# Patient Record
Sex: Female | Born: 1955 | Race: White | Hispanic: No | Marital: Married | Smoking: Former smoker
Health system: Southern US, Community
[De-identification: ages and names within clinical notes are randomized; demographics above are authoritative.]

## PROBLEM LIST (undated history)

## (undated) DIAGNOSIS — F909 Attention-deficit hyperactivity disorder, unspecified type: Secondary | ICD-10-CM

## (undated) DIAGNOSIS — E079 Disorder of thyroid, unspecified: Secondary | ICD-10-CM

## (undated) DIAGNOSIS — K219 Gastro-esophageal reflux disease without esophagitis: Secondary | ICD-10-CM

## (undated) DIAGNOSIS — E0590 Thyrotoxicosis, unspecified without thyrotoxic crisis or storm: Secondary | ICD-10-CM

## (undated) HISTORY — DX: Gastro-esophageal reflux disease without esophagitis: K21.9

## (undated) HISTORY — DX: Thyrotoxicosis, unspecified without thyrotoxic crisis or storm: E05.90

## (undated) HISTORY — DX: Attention-deficit hyperactivity disorder, unspecified type: F90.9

---

## 2003-01-24 HISTORY — PX: BREAST BIOPSY: SHX20

## 2010-10-05 LAB — HM COLONOSCOPY

## 2014-05-04 LAB — HM PAP SMEAR: HM Pap smear: NEGATIVE

## 2015-05-24 LAB — HM PAP SMEAR: HM Pap smear: NEGATIVE

## 2017-12-06 DIAGNOSIS — H9313 Tinnitus, bilateral: Secondary | ICD-10-CM | POA: Insufficient documentation

## 2018-03-27 LAB — TSH: TSH: 4.36 (ref 0.41–5.90)

## 2018-03-27 LAB — LIPID PANEL
Cholesterol: 234 — AB (ref 0–200)
HDL: 51 (ref 35–70)
LDL Cholesterol: 143
Triglycerides: 201 — AB (ref 40–160)

## 2018-09-09 LAB — HM MAMMOGRAPHY

## 2018-11-25 ENCOUNTER — Ambulatory Visit
Admission: EM | Admit: 2018-11-25 | Discharge: 2018-11-25 | Disposition: A | Discharge status: Home / Self Care | Payer: BC Managed Care – PPO | Attending: Family Medicine | Admitting: Family Medicine

## 2018-11-25 ENCOUNTER — Other Ambulatory Visit: Payer: Self-pay

## 2018-11-25 DIAGNOSIS — R300 Dysuria: Secondary | ICD-10-CM | POA: Diagnosis not present

## 2018-11-25 DIAGNOSIS — R350 Frequency of micturition: Secondary | ICD-10-CM | POA: Diagnosis not present

## 2018-11-25 DIAGNOSIS — R3915 Urgency of urination: Secondary | ICD-10-CM | POA: Diagnosis not present

## 2018-11-25 LAB — URINALYSIS, COMPLETE (UACMP) WITH MICROSCOPIC
Bacteria, UA: NONE SEEN
Bilirubin Urine: NEGATIVE
Glucose, UA: NEGATIVE mg/dL
Ketones, ur: NEGATIVE mg/dL
Nitrite: NEGATIVE
Protein, ur: NEGATIVE mg/dL
RBC / HPF: NONE SEEN RBC/hpf (ref 0–5)
Specific Gravity, Urine: <1.005 — ABNORMAL LOW (ref 1.005–1.030)
Squamous Epithelial / HPF: NONE SEEN (ref 0–5)
pH: 6 (ref 5.0–8.0)

## 2018-11-25 MED ORDER — CEPHALEXIN 500 MG PO CAPS: 500.0000 mg | ORAL_CAPSULE | Freq: Two times a day (BID) | ORAL | 0 refills | Status: AC

## 2018-11-25 NOTE — ED Triage Notes (Signed)
Patient complains of urinary urgency, frequency and pain after urinating.

## 2018-11-25 NOTE — Discharge Instructions (Addendum)
Take medication as prescribed. Rest. Drink plenty of fluids.  ° °Follow up with your primary care physician this week as needed. Return to Urgent care for new or worsening concerns.  ° °

## 2018-11-25 NOTE — ED Provider Notes (Signed)
[facility] ____________________________________________  Time seen: Approximately 3:18 PM  I have reviewed the triage vital signs and the nursing notes.   HISTORY  Chief Complaint Urinary Frequency   HPI Cassandra Hamilton is a 63 y.o. female presenting for evaluation of urinary frequency, urinary urgency and some pain after urinating present for the last several days.  Patient reports she did feels similar approximately 2 weeks ago in which she took a old prescription of nitrofurantoin.  States this did help but not fully resolved symptoms.  Denies abdominal pain, flank pain, fever, vomiting or diarrhea.  Some low back pain.  Has been moving recently.  New to the area.  Continues eat and drink well.  States history of multiple urinary tract infections in the past with same presentation.  Does take over-the-counter cranberry extract daily to help prevent.  Denies other aggravating or alleviating factors.    medical history UTIs GERD  There are no active problems to display for this patient.   Past Surgical History:  Procedure Laterality Date  . CESAREAN SECTION     x3     No current facility-administered medications for this encounter.   Current Outpatient Medications:  .  esomeprazole (NEXIUM) 20 MG capsule, Take 20 mg by mouth daily at 12 noon., Disp: , Rfl:  .  cephALEXin (KEFLEX) 500 MG capsule, Take 1 capsule (500 mg total) by mouth 2 (two) times daily for 7 days., Disp: 14 capsule, Rfl: 0  Allergies Ciprofloxacin  Family History  Problem Relation Age of Onset  . Cancer Mother   . Testicular cancer Father     Social History Social History   Tobacco Use  . Smoking status: Never Smoker  . Smokeless tobacco: Never Used  Substance Use Topics  . Alcohol use: Yes    Comment: occasionally  . Drug use: Yes    Types: Marijuana    Comment: also does edible.     Review of Systems Constitutional: No fever Cardiovascular: Denies chest pain.  Respiratory: Denies shortness of breath. Gastrointestinal: No abdominal pain.  No nausea, no vomiting.  No diarrhea. Genitourinary: Positive for dysuria. Skin: Negative for rash.    ____________________________________________   PHYSICAL EXAM:  VITAL SIGNS: ED Triage Vitals  Enc Vitals Group     BP 11/25/18 1436 121/85     Pulse Rate 11/25/18 1436 89     Resp 11/25/18 1436 16     Temp 11/25/18 1436 98.6 F (37 C)     Temp Source 11/25/18 1436 Oral     SpO2 11/25/18 1436 100 %     Weight 11/25/18 1433 142 lb (64.4 kg)     Height 11/25/18 1433 5\' 6"  (1.676 m)     Head Circumference --      Peak Flow --      Pain Score 11/25/18 1433 6     Pain Loc --      Pain Edu? --      Excl. in [place]? --     Constitutional: Alert and oriented. Well appearing and in no acute distress. Eyes: Conjunctivae are normal.  ENT      Head: Normocephalic and atraumatic. Cardiovascular: Normal rate, regular rhythm. Grossly normal heart sounds.  Good peripheral circulation. Respiratory: Normal respiratory effort without tachypnea nor retractions. Breath sounds are clear and equal bilaterally. No wheezes, rales, rhonchi. Gastrointestinal: Soft and nontender. No CVA tenderness. Musculoskeletal: Steady gait. No lower back tenderness to palpation. Neurologic:  Normal speech and language. Speech is normal. No gait  instability.  Skin:  Skin is warm, dry and intact. No rash noted. Psychiatric: Mood and affect are normal. Speech and behavior are normal. Patient exhibits appropriate insight and judgment   ___________________________________________   LABS (all labs ordered are listed, but only abnormal results are displayed)  Labs Reviewed  URINALYSIS, COMPLETE (UACMP) WITH MICROSCOPIC - Abnormal; Notable for the following components:      Result Value   Color, Urine STRAW (*)    Specific Gravity, Urine <1.005 (*)    Hgb urine dipstick TRACE (*)    Leukocytes,Ua TRACE (*)    All other components  within normal limits  URINE CULTURE    PROCEDURES Procedures   INITIAL IMPRESSION / ASSESSMENT AND PLAN / ED COURSE  Pertinent labs & imaging results that were available during my care of the patient were reviewed by me and considered in my medical decision making (see chart for details).  Well-appearing patient.  No acute distress.  Dysuria complaints.  Consistent with her previous UTIs.  However urinalysis reviewed, discussed with patient not clear UTI.  Will treat patient with oral Keflex empirically and await urine culture.  Discussed other causes and triggers for dysuria as well.  Encourage fluids, rest and monitoring.Discussed indication, risks and benefits of medications with patient.   Discussed follow up and return parameters including no resolution or any worsening concerns. Patient verbalized understanding and agreed to plan.   ____________________________________________   FINAL CLINICAL IMPRESSION(S) / ED DIAGNOSES  Final diagnoses:  Dysuria     ED Discharge Orders         Ordered    cephALEXin (KEFLEX) 500 MG capsule  2 times daily     11/25/18 1509           Note: This dictation was prepared with Dragon dictation along with smaller phrase technology. Any transcriptional errors that result from this process are unintentional.         Renford Dills, NP 11/25/18 [phone]

## 2018-11-26 LAB — URINE CULTURE: Culture: <10000 — AB

## 2018-11-27 ENCOUNTER — Encounter: Payer: Self-pay | Admitting: Internal Medicine

## 2018-12-09 ENCOUNTER — Encounter: Payer: Self-pay | Admitting: Internal Medicine

## 2018-12-25 ENCOUNTER — Encounter: Payer: Self-pay | Admitting: Internal Medicine

## 2018-12-25 ENCOUNTER — Other Ambulatory Visit: Payer: Self-pay

## 2018-12-25 ENCOUNTER — Ambulatory Visit (INDEPENDENT_AMBULATORY_CARE_PROVIDER_SITE_OTHER): Payer: BC Managed Care – PPO | Admitting: Internal Medicine

## 2018-12-25 VITALS — BP 128/78 | HR 83 | Ht 66.0 in | Wt 146.0 lb

## 2018-12-25 DIAGNOSIS — M19042 Primary osteoarthritis, left hand: Secondary | ICD-10-CM

## 2018-12-25 DIAGNOSIS — E782 Mixed hyperlipidemia: Secondary | ICD-10-CM | POA: Diagnosis not present

## 2018-12-25 DIAGNOSIS — K2100 Gastro-esophageal reflux disease with esophagitis, without bleeding: Secondary | ICD-10-CM | POA: Insufficient documentation

## 2018-12-25 DIAGNOSIS — Z13820 Encounter for screening for osteoporosis: Secondary | ICD-10-CM | POA: Diagnosis not present

## 2018-12-25 DIAGNOSIS — M19041 Primary osteoarthritis, right hand: Secondary | ICD-10-CM | POA: Diagnosis not present

## 2018-12-25 NOTE — Progress Notes (Signed)
Date:  12/25/2018   Name:  Cassandra Hamilton   DOB:  1955/10/03   MRN:  [id number]   Chief Complaint: Establish Care, Gastroesophageal Reflux (Taking nexium and wanting to know if she should continue taking it daily or come off of it. Been taking it since July 2020. ), and Osteoarthritis (Referral for arthritis in hands. )  Mammogram 09/09/2018 - normal Colonoscopy 2012 DEXA - remote  Gastroesophageal Reflux She complains of heartburn. She reports no abdominal pain, no chest pain, no coughing or no wheezing. This is a recurrent problem. The problem occurs occasionally. Pertinent negatives include no fatigue. There are no known risk factors. She has tried a PPI for the symptoms. The treatment provided significant relief. Past procedures include an EGD (showing only esophagitis). She is concerned that taking Nexium long term could have adverse side effects.  Hand Pain  There was no injury mechanism. The quality of the pain is described as aching (the right thumb DIP). Pertinent negatives include no chest pain.  She has OA changes of most DIP joints but no pain or limitation to activities.  No results found for: CREATININE, BUN, NA, K, CL, CO2 No results found for: CHOL, HDL, LDLCALC, LDLDIRECT, TRIG, CHOLHDL No results found for: TSH No results found for: HGBA1C   Review of Systems  Constitutional: Negative for chills, fatigue, fever and unexpected weight change.  HENT: Negative for trouble swallowing.   Respiratory: Negative for cough, chest tightness, shortness of breath and wheezing.   Cardiovascular: Negative for chest pain, palpitations and leg swelling.  Gastrointestinal: Positive for heartburn. Negative for abdominal pain, constipation and diarrhea.  Genitourinary:       Recurrent UTIs  Musculoskeletal: Positive for arthralgias and joint swelling. Negative for myalgias.  Skin: Negative for color change and rash.  Neurological: Negative for facial asymmetry, light-headedness and  headaches.  Psychiatric/Behavioral: Negative for dysphoric mood and sleep disturbance. The patient is not nervous/anxious.     There are no active problems to display for this patient.   Allergies  Allergen Reactions  . Ciprofloxacin     Rash   . Latex Rash    Past Surgical History:  Procedure Laterality Date  . CESAREAN SECTION     x3    Social History   Tobacco Use  . Smoking status: Former Smoker    Packs/day: 0.50    Years: 30.00    Pack years: 15.00    Quit date: 1999    Years since quitting: 21.9  . Smokeless tobacco: Never Used  Substance Use Topics  . Alcohol use: Yes    Alcohol/week: 2.0 - 4.0 standard drinks    Types: 2 - 4 Standard drinks or equivalent per week  . Drug use: Yes    Types: Marijuana    Comment: also does edible.      Medication list has been reviewed and updated.  Current Meds  Medication Sig  . b complex vitamins tablet Take 1 tablet by mouth daily.  . calcium-vitamin D (OSCAL WITH D) 500-200 MG-UNIT tablet Take 1 tablet by mouth.  Tilman Neat EXTRACT PO Take by mouth.  . esomeprazole (NEXIUM) 20 MG capsule Take 20 mg by mouth daily at 12 noon.  Marland Kitchen Kelp 100 MG TABS Take by mouth.    PHQ 2/9 Scores 12/25/2018  PHQ - 2 Score 0    BP Readings from Last 3 Encounters:  12/25/18 128/78  11/25/18 121/85    Physical Exam Vitals signs and nursing note reviewed.  Constitutional:      General: She is not in acute distress.    Appearance: She is well-developed.  HENT:     Head: Normocephalic and atraumatic.  Neck:     Vascular: No carotid bruit.  Cardiovascular:     Rate and Rhythm: Normal rate and regular rhythm.     Pulses: Normal pulses.     Heart sounds: No murmur.  Pulmonary:     Effort: Pulmonary effort is normal. No respiratory distress.     Breath sounds: Normal breath sounds. No wheezing.  Musculoskeletal: Normal range of motion.     Right lower leg: No edema.     Left lower leg: No edema.     Comments: Bouchard and  heberden nodes both hands, worse on right DIP right thumb more swollen but not red or hot; mildly tender with decreased ROM  Lymphadenopathy:     Cervical: Cervical adenopathy present.  Skin:    General: Skin is warm and dry.     Capillary Refill: Capillary refill takes less than 2 seconds.     Findings: No rash.  Neurological:     General: No focal deficit present.     Mental Status: She is alert and oriented to person, place, and time.  Psychiatric:        Attention and Perception: Attention normal.        Mood and Affect: Mood normal.        Behavior: Behavior normal.        Thought Content: Thought content normal.     Wt Readings from Last 3 Encounters:  12/25/18 146 lb (66.2 kg)  11/25/18 142 lb (64.4 kg)    BP 128/78   Pulse 83   Ht 5\' 6"  (1.676 m)   Wt 146 lb (66.2 kg)   SpO2 97%   BMI 23.57 kg/m   Assessment and Plan: 1. Gastroesophageal reflux disease with esophagitis without hemorrhage Symptoms well controlled on daily PPI No red flag signs such as weight loss, n/v, melena Will request EGD records from [place] to determine next evaluation Benefits of PPI outweigh risks at this time Would recommend a DEXA  2. Mixed hyperlipidemia Borderline elevated - recommend continuing healthy diet and exercise  3. Encounter for screening for osteoporosis - DG Bone Density; Future  4. Primary osteoarthritis of both hands Continue activity as tolerated Topical rubs PRN   Partially dictated using Editor, commissioning. Any errors are unintentional.  Halina Maidens, MD [facility]  12/25/2018

## 2018-12-31 ENCOUNTER — Encounter: Payer: Self-pay | Admitting: Internal Medicine

## 2019-01-06 ENCOUNTER — Telehealth: Payer: Self-pay | Admitting: Internal Medicine

## 2019-01-06 NOTE — Telephone Encounter (Signed)
Patient informed and understood.  CM

## 2019-01-06 NOTE — Telephone Encounter (Signed)
Please let patient know that I received her EGD report from 2018.  There were no worrisome findings that required a routine follow up.  She can continue to use Nexium as needed.

## 2019-01-08 ENCOUNTER — Other Ambulatory Visit: Payer: BC Managed Care – PPO

## 2019-01-09 ENCOUNTER — Ambulatory Visit
Admission: RE | Admit: 2019-01-09 | Discharge: 2019-01-09 | Disposition: A | Discharge status: Home / Self Care | Payer: BC Managed Care – PPO | Source: Ambulatory Visit | Attending: Internal Medicine | Admitting: Internal Medicine

## 2019-01-09 DIAGNOSIS — Z13820 Encounter for screening for osteoporosis: Secondary | ICD-10-CM | POA: Insufficient documentation

## 2019-01-09 DIAGNOSIS — M85851 Other specified disorders of bone density and structure, right thigh: Secondary | ICD-10-CM | POA: Diagnosis not present

## 2019-01-10 ENCOUNTER — Encounter: Payer: Self-pay | Admitting: Internal Medicine

## 2019-01-13 ENCOUNTER — Encounter: Payer: Self-pay | Admitting: Internal Medicine

## 2019-03-18 DIAGNOSIS — K0500 Acute gingivitis, plaque induced: Secondary | ICD-10-CM | POA: Diagnosis not present

## 2019-03-18 DIAGNOSIS — Z1281 Encounter for screening for malignant neoplasm of oral cavity: Secondary | ICD-10-CM | POA: Diagnosis not present

## 2019-03-18 DIAGNOSIS — K036 Deposits [accretions] on teeth: Secondary | ICD-10-CM | POA: Diagnosis not present

## 2019-03-20 ENCOUNTER — Encounter: Payer: Self-pay | Admitting: Internal Medicine

## 2019-04-08 ENCOUNTER — Ambulatory Visit (INDEPENDENT_AMBULATORY_CARE_PROVIDER_SITE_OTHER): Payer: BC Managed Care – PPO | Admitting: Internal Medicine

## 2019-04-08 ENCOUNTER — Encounter: Payer: Self-pay | Admitting: Internal Medicine

## 2019-04-08 ENCOUNTER — Other Ambulatory Visit: Payer: Self-pay

## 2019-04-08 VITALS — BP 126/72 | HR 84 | Temp 97.3°F | Ht 66.0 in | Wt 152.0 lb

## 2019-04-08 DIAGNOSIS — N3001 Acute cystitis with hematuria: Secondary | ICD-10-CM | POA: Diagnosis not present

## 2019-04-08 DIAGNOSIS — F39 Unspecified mood [affective] disorder: Secondary | ICD-10-CM

## 2019-04-08 LAB — POC URINALYSIS WITH MICROSCOPIC (NON AUTO)MANUAL RESULT
Bilirubin, UA: NEGATIVE
Crystals: 0
Glucose, UA: NEGATIVE
Ketones, UA: NEGATIVE
Mucus, UA: 0
Nitrite, UA: NEGATIVE
Protein, UA: NEGATIVE
RBC: 2 M/uL — AB (ref 4.04–5.48)
Spec Grav, UA: 1.01 (ref 1.010–1.025)
Urobilinogen, UA: 0.2 E.U./dL
pH, UA: 6 (ref 5.0–8.0)

## 2019-04-08 MED ORDER — NITROFURANTOIN MONOHYD MACRO 100 MG PO CAPS: 100.0000 mg | ORAL_CAPSULE | Freq: Two times a day (BID) | ORAL | 0 refills | Status: DC

## 2019-04-08 NOTE — Progress Notes (Signed)
Date:  04/08/2019   Name:  Cassandra Hamilton   DOB:  Apr 09, 1955   MRN:  [id number]   Chief Complaint: Urinary Tract Infection (Pain when urinating- esp when finishing urinating. Bladder cramps. Started yesterday at noon. ) and Depression (PHQ9- 9)  Urinary Tract Infection  This is a new problem. The current episode started yesterday. The quality of the pain is described as burning. The pain is mild. There has been no fever. Associated symptoms include chills, frequency and urgency. Pertinent negatives include no nausea or vomiting. She has tried increased fluids for the symptoms. The treatment provided no relief.  Depression        This is a recurrent problem.  The problem occurs intermittently.  Associated symptoms include fatigue, irritable, decreased interest and sad.  Associated symptoms include no helplessness, does not have insomnia and no suicidal ideas.  Treatments tried: remote history of SSRI use during a divorce.   No results found for: CREATININE, BUN, NA, K, CL, CO2 Lab Results  Component Value Date   CHOL 234 (A) 03/27/2018   HDL 51 03/27/2018   LDLCALC 143 03/27/2018   TRIG 201 (A) 03/27/2018   Lab Results  Component Value Date   TSH 4.36 03/27/2018   No results found for: HGBA1C No results found for: WBC, HGB, HCT, MCV, PLT No results found for: ALT, AST, GGT, ALKPHOS, BILITOT   Review of Systems  Constitutional: Positive for chills and fatigue. Negative for fever.  Respiratory: Negative for chest tightness and shortness of breath.   Cardiovascular: Negative for chest pain.  Gastrointestinal: Positive for abdominal pain. Negative for diarrhea, nausea and vomiting.  Genitourinary: Positive for dysuria, frequency and urgency.  Psychiatric/Behavioral: Positive for depression, dysphoric mood and sleep disturbance. Negative for suicidal ideas. The patient is not nervous/anxious and does not have insomnia.     Patient Active Problem List   Diagnosis Date Noted  .  Mixed hyperlipidemia 12/25/2018  . Gastroesophageal reflux disease with esophagitis without hemorrhage 12/25/2018  . Primary osteoarthritis of both hands 12/25/2018    Allergies  Allergen Reactions  . Ciprofloxacin     Rash   . Latex Rash    Past Surgical History:  Procedure Laterality Date  . CESAREAN SECTION     x3    Social History   Tobacco Use  . Smoking status: Former Smoker    Packs/day: 0.50    Years: 30.00    Pack years: 15.00    Quit date: 1999    Years since quitting: 22.2  . Smokeless tobacco: Never Used  Substance Use Topics  . Alcohol use: Yes    Alcohol/week: 2.0 - 4.0 standard drinks    Types: 2 - 4 Standard drinks or equivalent per week  . Drug use: Yes    Types: Marijuana    Comment: also does edible.      Medication list has been reviewed and updated.  Current Meds  Medication Sig  . Ascorbic Acid (VITAMIN C) 1000 MG tablet Take 1,000 mg by mouth daily.  Marland Kitchen b complex vitamins tablet Take 1 tablet by mouth daily.  . Boron 3 MG CAPS Take by mouth.  . calcium-vitamin D (OSCAL WITH D) 500-200 MG-UNIT tablet Take 1 tablet by mouth. Separate pill.  Marland Kitchen CRANBERRY EXTRACT PO Take by mouth.  . esomeprazole (NEXIUM) 20 MG capsule Take 20 mg by mouth daily at 12 noon.  Marland Kitchen Kelp 100 MG TABS Take by mouth.  . magnesium 30 MG tablet Take 30  mg by mouth 2 (two) times daily.  . Turmeric 500 MG CAPS Take by mouth.    PHQ 2/9 Scores 04/08/2019 12/25/2018  PHQ - 2 Score 4 0  PHQ- 9 Score 9 -    BP Readings from Last 3 Encounters:  04/08/19 126/72  12/25/18 128/78  11/25/18 121/85    Physical Exam Vitals and nursing note reviewed.  Constitutional:      General: She is irritable. She is not in acute distress.    Appearance: Normal appearance. She is well-developed.  Cardiovascular:     Rate and Rhythm: Normal rate and regular rhythm.     Heart sounds: Normal heart sounds.  Pulmonary:     Effort: Pulmonary effort is normal. No respiratory distress.      Breath sounds: Normal breath sounds.  Abdominal:     General: Bowel sounds are normal.     Palpations: Abdomen is soft.     Tenderness: There is abdominal tenderness in the suprapubic area. There is no guarding or rebound.  Musculoskeletal:     Cervical back: Normal range of motion.  Neurological:     Mental Status: She is alert.  Psychiatric:        Mood and Affect: Mood normal.        Behavior: Behavior normal.     Wt Readings from Last 3 Encounters:  04/08/19 152 lb (68.9 kg)  12/25/18 146 lb (66.2 kg)  11/25/18 142 lb (64.4 kg)    BP 126/72   Pulse 84   Temp (!) 97.3 F (36.3 C) (Temporal)   Ht 5\' 6"  (1.676 m)   Wt 152 lb (68.9 kg)   SpO2 100%   BMI 24.53 kg/m   Assessment and Plan: 1. Acute cystitis with hematuria Continue fluids Return if sx persist - POC urinalysis w microscopic (non auto) - nitrofurantoin, macrocrystal-monohydrate, (MACROBID) 100 MG capsule; Take 1 capsule (100 mg total) by mouth 2 (two) times daily for 7 days.  Dispense: 14 capsule; Refill: 0  2. Mood disorder ([place]) Mild intermittent symptoms for years not requiring treatment Continue to monitor - return if worsening   Partially dictated using Editor, commissioning. Any errors are unintentional.  Halina Maidens, MD [facility]  04/08/2019

## 2019-04-08 NOTE — Patient Instructions (Signed)
Covid Vaccine locations: [facility] [phone]  [facility] [phone]  [email or web address]  [email or web address] and search vaccine availability

## 2019-04-10 ENCOUNTER — Other Ambulatory Visit: Payer: Self-pay | Admitting: Internal Medicine

## 2019-04-10 DIAGNOSIS — N3001 Acute cystitis with hematuria: Secondary | ICD-10-CM

## 2019-04-10 MED ORDER — SULFAMETHOXAZOLE-TRIMETHOPRIM 800-160 MG PO TABS: 1.0000 | ORAL_TABLET | Freq: Two times a day (BID) | ORAL | 0 refills | Status: AC

## 2019-04-11 ENCOUNTER — Telehealth: Payer: Self-pay

## 2019-04-11 NOTE — Telephone Encounter (Signed)
Sent in another antibiotic

## 2019-04-11 NOTE — Telephone Encounter (Signed)
Patient called saying she started on Macrobid several days ago for her UTI and she is in more pain that she was before. Said macrobid has not helped her in the past. Wants to know if she can try a different antibiotic for her UTI symptoms.  Please Advise.  CM

## 2019-04-29 ENCOUNTER — Other Ambulatory Visit: Payer: Self-pay

## 2019-04-29 ENCOUNTER — Encounter: Payer: Self-pay | Admitting: Internal Medicine

## 2019-04-29 ENCOUNTER — Ambulatory Visit (INDEPENDENT_AMBULATORY_CARE_PROVIDER_SITE_OTHER): Payer: BC Managed Care – PPO | Admitting: Internal Medicine

## 2019-04-29 VITALS — BP 112/62 | HR 73 | Temp 97.1°F | Ht 66.0 in | Wt 150.0 lb

## 2019-04-29 DIAGNOSIS — M19041 Primary osteoarthritis, right hand: Secondary | ICD-10-CM | POA: Diagnosis not present

## 2019-04-29 DIAGNOSIS — G5602 Carpal tunnel syndrome, left upper limb: Secondary | ICD-10-CM | POA: Diagnosis not present

## 2019-04-29 DIAGNOSIS — Z0000 Encounter for general adult medical examination without abnormal findings: Secondary | ICD-10-CM

## 2019-04-29 DIAGNOSIS — Z1231 Encounter for screening mammogram for malignant neoplasm of breast: Secondary | ICD-10-CM | POA: Diagnosis not present

## 2019-04-29 DIAGNOSIS — M19042 Primary osteoarthritis, left hand: Secondary | ICD-10-CM

## 2019-04-29 DIAGNOSIS — K2100 Gastro-esophageal reflux disease with esophagitis, without bleeding: Secondary | ICD-10-CM

## 2019-04-29 DIAGNOSIS — H9313 Tinnitus, bilateral: Secondary | ICD-10-CM | POA: Diagnosis not present

## 2019-04-29 LAB — POCT URINALYSIS DIPSTICK
Bilirubin, UA: NEGATIVE
Blood, UA: NEGATIVE
Glucose, UA: NEGATIVE
Ketones, UA: NEGATIVE
Nitrite, UA: NEGATIVE
Protein, UA: NEGATIVE
Spec Grav, UA: 1.01 (ref 1.010–1.025)
Urobilinogen, UA: 0.2 E.U./dL
pH, UA: 8 (ref 5.0–8.0)

## 2019-04-29 NOTE — Patient Instructions (Addendum)
Dr. Aubery Lapping - Dermatology [phone]   Carpal Tunnel Syndrome  Carpal tunnel syndrome is a condition that causes pain in your hand and arm. The carpal tunnel is a narrow area located on the palm side of your wrist. Repeated wrist motion or certain diseases may cause swelling within the tunnel. This swelling pinches the main nerve in the wrist (median nerve). What are the causes? This condition may be caused by:  Repeated wrist motions.  Wrist injuries.  Arthritis.  A cyst or tumor in the carpal tunnel.  Fluid buildup during pregnancy. Sometimes the cause of this condition is not known. What increases the risk? The following factors may make you more likely to develop this condition:  Having a job, such as being a Research scientist (life sciences), that requires you to repeatedly move your wrist in the same motion.  Being a woman.  Having certain conditions, such as: ? Diabetes. ? Obesity. ? An underactive thyroid (hypothyroidism). ? Kidney failure. What are the signs or symptoms? Symptoms of this condition include:  A tingling feeling in your fingers, especially in your thumb, index, and middle fingers.  Tingling or numbness in your hand.  An aching feeling in your entire arm, especially when your wrist and elbow are bent for a long time.  Wrist pain that goes up your arm to your shoulder.  Pain that goes down into your palm or fingers.  A weak feeling in your hands. You may have trouble grabbing and holding items. Your symptoms may feel worse during the night. How is this diagnosed? This condition is diagnosed with a medical history and physical exam. You may also have tests, including:  Electromyogram (EMG). This test measures electrical signals sent by your nerves into the muscles.  Nerve conduction study. This test measures how well electrical signals pass through your nerves.  Imaging tests, such as X-rays, ultrasound, and MRI. These tests check for possible  causes of your condition. How is this treated? This condition may be treated with:  Lifestyle changes. It is important to stop or change the activity that caused your condition.  Doing exercise and activities to strengthen your muscles and bones (physical therapy).  Learning how to use your hand again after diagnosis (occupational therapy).  Medicines for pain and inflammation. This may include medicine that is injected into your wrist.  A wrist splint.  Surgery. Follow these instructions at home: If you have a splint:  Wear the splint as told by your health care provider. Remove it only as told by your health care provider.  Loosen the splint if your fingers tingle, become numb, or turn cold and blue.  Keep the splint clean.  If the splint is not waterproof: ? Do not let it get wet. ? Cover it with a watertight covering when you take a bath or shower. Managing pain, stiffness, and swelling   If directed, put ice on the painful area: ? If you have a removable splint, remove it as told by your health care provider. ? Put ice in a plastic bag. ? Place a towel between your skin and the bag. ? Leave the ice on for 20 minutes, 2-3 times per day. General instructions  Take over-the-counter and prescription medicines only as told by your health care provider.  Rest your wrist from any activity that may be causing your pain. If your condition is work related, talk with your employer about changes that can be made, such as getting a wrist pad to use while  typing.  Do any exercises as told by your health care provider, physical therapist, or occupational therapist.  Keep all follow-up visits as told by your health care provider. This is important. Contact a health care provider if:  You have new symptoms.  Your pain is not controlled with medicines.  Your symptoms get worse. Get help right away if:  You have severe numbness or tingling in your wrist or  hand. Summary  Carpal tunnel syndrome is a condition that causes pain in your hand and arm.  It is usually caused by repeated wrist motions.  Lifestyle changes and medicines are used to treat carpal tunnel syndrome. Surgery may be recommended.  Follow your health care provider's instructions about wearing a splint, resting from activity, keeping follow-up visits, and calling for help. This information is not intended to replace advice given to you by your health care provider. Make sure you discuss any questions you have with your health care provider. Document Revised: 05/18/2017 Document Reviewed: 05/18/2017 Elsevier Patient Education  2020 [employer].

## 2019-04-29 NOTE — Progress Notes (Signed)
Date:  04/29/2019   Name:  Cassandra Hamilton   DOB:  [birth year]   MRN:  [id number]   Chief Complaint: Annual Exam (breast exam - no pap) [name] is a 64 y.o. female who presents today for her Complete Annual Exam. She feels well. She reports exercising regularly. She reports she is sleeping fairly well.   Mammogram  08/2018 @ [facility]  05/2015 NIL with neg HPV CRC screen  09/2010 Immunization History  Administered Date(s) Administered  . Hepatitis A 10/27/2014  . Hepatitis A, Adult 10/27/2014, 05/05/2015  . Influenza,inj,Quad PF,6+ Mos 11/25/2018  . Influenza-Unspecified 10/24/2018  . Tdap 04/08/2009  . Zoster 05/05/2015  . Zoster Recombinat (Shingrix) 11/23/2016, 12/17/2016, 12/09/2018    Gastroesophageal Reflux She complains of heartburn. She reports no abdominal pain, no chest pain, no coughing or no wheezing. Pertinent negatives include no fatigue. She has tried a PPI for the symptoms. The treatment provided significant relief.  Hand Pain  There was no injury mechanism. The pain is present in the right fingers and left fingers. The quality of the pain is described as aching. The pain is mild. The pain has been fluctuating since the incident. Pertinent negatives include no chest pain. Associated symptoms comments: OA changes.    No results found for: CREATININE, BUN, NA, K, CL, CO2 Lab Results  Component Value Date   CHOL 234 (A) 03/27/2018   HDL 51 03/27/2018   LDLCALC 143 03/27/2018   TRIG 201 (A) 03/27/2018   Lab Results  Component Value Date   TSH 4.36 03/27/2018   No results found for: HGBA1C No results found for: WBC, HGB, HCT, MCV, PLT No results found for: ALT, AST, GGT, ALKPHOS, BILITOT   Review of Systems  Constitutional: Negative for chills, fatigue, fever and unexpected weight change.  HENT: Positive for hearing loss (has aid on right) and tinnitus. Negative for congestion, trouble swallowing and voice change.   Eyes: Negative for visual disturbance.   Respiratory: Negative for cough, chest tightness, shortness of breath and wheezing.   Cardiovascular: Positive for palpitations. Negative for chest pain and leg swelling.  Gastrointestinal: Positive for heartburn. Negative for abdominal pain, constipation, diarrhea and vomiting.  Endocrine: Negative for polydipsia and polyuria.  Genitourinary: Negative for dysuria, frequency, genital sores, vaginal bleeding and vaginal discharge.  Musculoskeletal: Negative for arthralgias, gait problem and joint swelling.  Skin: Negative for color change and rash.  Neurological: Negative for dizziness, tremors, light-headedness and headaches.  Hematological: Negative for adenopathy. Does not bruise/bleed easily.  Psychiatric/Behavioral: Negative for dysphoric mood and sleep disturbance. The patient is not nervous/anxious.     Patient Active Problem List   Diagnosis Date Noted  . Mixed hyperlipidemia 12/25/2018  . Gastroesophageal reflux disease with esophagitis without hemorrhage 12/25/2018  . Primary osteoarthritis of both hands 12/25/2018  . Tinnitus of both ears 12/06/2017    Allergies  Allergen Reactions  . Ciprofloxacin     Rash   . Latex Rash    Past Surgical History:  Procedure Laterality Date  . CESAREAN SECTION     x3    Social History   Tobacco Use  . Smoking status: Former Smoker    Packs/day: 0.50    Years: 30.00    Pack years: 15.00    Quit date: 1999    Years since quitting: 22.2  . Smokeless tobacco: Never Used  Substance Use Topics  . Alcohol use: Yes    Alcohol/week: 2.0 - 4.0 standard drinks    Types:  2 - 4 Standard drinks or equivalent per week  . Drug use: Yes    Types: Marijuana    Comment: also does edible.      Medication list has been reviewed and updated.  Current Meds  Medication Sig  . Ascorbic Acid (VITAMIN C) 1000 MG tablet Take 1,000 mg by mouth daily.  Marland Kitchen b complex vitamins tablet Take 1 tablet by mouth daily.  . Boron 3 MG CAPS Take by  mouth. Tablet  . CRANBERRY EXTRACT PO Take by mouth.  . esomeprazole (NEXIUM) 20 MG capsule Take 20 mg by mouth daily at 12 noon.  Marland Kitchen Kelp 100 MG TABS Take by mouth.  . magnesium 30 MG tablet Take 30 mg by mouth.   . Turmeric 500 MG CAPS Take by mouth.    PHQ 2/9 Scores 04/29/2019 04/08/2019 12/25/2018  PHQ - 2 Score 0 4 0  PHQ- 9 Score 0 9 -    BP Readings from Last 3 Encounters:  04/29/19 112/62  04/08/19 126/72  12/25/18 128/78    Physical Exam Vitals and nursing note reviewed.  Constitutional:      General: She is not in acute distress.    Appearance: Normal appearance. She is well-developed.  HENT:     Head: Normocephalic and atraumatic.     Right Ear: Tympanic membrane and ear canal normal.     Left Ear: Tympanic membrane and ear canal normal.     Nose:     Right Sinus: No maxillary sinus tenderness.     Left Sinus: No maxillary sinus tenderness.  Eyes:     General: No scleral icterus.       Right eye: No discharge.        Left eye: No discharge.     Conjunctiva/sclera: Conjunctivae normal.  Neck:     Thyroid: No thyromegaly.     Vascular: No carotid bruit.  Cardiovascular:     Rate and Rhythm: Normal rate and regular rhythm.     Pulses: Normal pulses.     Heart sounds: Normal heart sounds.  Pulmonary:     Effort: Pulmonary effort is normal. No respiratory distress.     Breath sounds: No wheezing.  Chest:     Breasts:        Right: No mass, nipple discharge, skin change or tenderness.        Left: No mass, nipple discharge, skin change or tenderness.  Abdominal:     General: Bowel sounds are normal.     Palpations: Abdomen is soft.     Tenderness: There is no abdominal tenderness.  Musculoskeletal:        General: Normal range of motion.     Cervical back: Normal range of motion. No erythema.     Right lower leg: No edema.     Left lower leg: No edema.     Comments: OA changes to fingers of both hands  Lymphadenopathy:     Cervical: No cervical  adenopathy.  Skin:    General: Skin is warm and dry.     Capillary Refill: Capillary refill takes less than 2 seconds.     Findings: No rash.  Neurological:     Mental Status: She is alert and oriented to person, place, and time.     Cranial Nerves: No cranial nerve deficit.     Sensory: No sensory deficit.     Motor: Motor function is intact.     Coordination: Coordination is intact.  Deep Tendon Reflexes:     Reflex Scores:      Bicep reflexes are 1+ on the right side and 1+ on the left side.      Patellar reflexes are 2+ on the right side and 2+ on the left side.    Comments: Phalen's positive on left  Psychiatric:        Attention and Perception: Attention normal.        Mood and Affect: Mood normal.        Speech: Speech normal.        Behavior: Behavior normal.     Wt Readings from Last 3 Encounters:  04/29/19 150 lb (68 kg)  04/08/19 152 lb (68.9 kg)  12/25/18 146 lb (66.2 kg)    BP 112/62   Pulse 73   Temp (!) 97.1 F (36.2 C) (Temporal)   Ht 5\' 6"  (1.676 m)   Wt 150 lb (68 kg)   SpO2 98%   BMI 24.21 kg/m   Assessment and Plan: 1. Annual physical exam Normal exam Continue healthy diet, regular exercise - Lipid panel - TSH - POCT urinalysis dipstick  2. Encounter for screening mammogram for breast cancer To be scheduled at [facility] - MM 3D SCREEN BREAST BILATERAL; Future  3. Gastroesophageal reflux disease with esophagitis without hemorrhage Symptoms well controlled on daily PPI No red flag signs such as weight loss, n/v, melena Will continue over the counter nexium. - CBC with Differential/Platelet  4. Tinnitus of both ears With right sided hearing loss - Comprehensive metabolic panel  5. Primary osteoarthritis of both hands Continue topical pain relievers Advil as needed  6. Carpal tunnel syndrome of left wrist Avoid prolonged, repetitive movements Symptoms are very mild at present - no intervention other than activity modification is  needed now  Partially dictated using [facility]. Any errors are unintentional.  Animal nutritionist, MD [facility]  04/29/2019

## 2019-04-30 LAB — COMPREHENSIVE METABOLIC PANEL
ALT: 15 IU/L (ref 0–32)
AST: 17 IU/L (ref 0–40)
Albumin/Globulin Ratio: 2.2 (ref 1.2–2.2)
Albumin: 4.3 g/dL (ref 3.8–4.8)
Alkaline Phosphatase: 94 IU/L (ref 39–117)
BUN/Creatinine Ratio: 14 (ref 12–28)
BUN: 9 mg/dL (ref 8–27)
Bilirubin Total: 0.2 mg/dL (ref 0.0–1.2)
CO2: 24 mmol/L (ref 20–29)
Calcium: 9.1 mg/dL (ref 8.7–10.3)
Chloride: 106 mmol/L (ref 96–106)
Creatinine, Ser: 0.64 mg/dL (ref 0.57–1.00)
GFR calc Af Amer: 109 mL/min/{1.73_m2} (ref >59)
GFR calc non Af Amer: 95 mL/min/{1.73_m2} (ref >59)
Globulin, Total: 2 g/dL (ref 1.5–4.5)
Glucose: 93 mg/dL (ref 65–99)
Potassium: 4.3 mmol/L (ref 3.5–5.2)
Sodium: 142 mmol/L (ref 134–144)
Total Protein: 6.3 g/dL (ref 6.0–8.5)

## 2019-04-30 LAB — LIPID PANEL
Chol/HDL Ratio: 4.8 ratio — ABNORMAL HIGH (ref 0.0–4.4)
Cholesterol, Total: 207 mg/dL — ABNORMAL HIGH (ref 100–199)
HDL: 43 mg/dL (ref >39)
LDL Chol Calc (NIH): 141 mg/dL — ABNORMAL HIGH (ref 0–99)
Triglycerides: 128 mg/dL (ref 0–149)
VLDL Cholesterol Cal: 23 mg/dL (ref 5–40)

## 2019-04-30 LAB — CBC WITH DIFFERENTIAL/PLATELET
Basophils Absolute: 0.1 10*3/uL (ref 0.0–0.2)
Basos: 1 %
EOS (ABSOLUTE): 0.3 10*3/uL (ref 0.0–0.4)
Eos: 5 %
Hematocrit: 40 % (ref 34.0–46.6)
Hemoglobin: 13.5 g/dL (ref 11.1–15.9)
Immature Grans (Abs): 0 10*3/uL (ref 0.0–0.1)
Immature Granulocytes: 0 %
Lymphocytes Absolute: 2.2 10*3/uL (ref 0.7–3.1)
Lymphs: 36 %
MCH: 33 pg (ref 26.6–33.0)
MCHC: 33.8 g/dL (ref 31.5–35.7)
MCV: 98 fL — ABNORMAL HIGH (ref 79–97)
Monocytes Absolute: 0.6 10*3/uL (ref 0.1–0.9)
Monocytes: 10 %
Neutrophils Absolute: 2.9 10*3/uL (ref 1.4–7.0)
Neutrophils: 48 %
Platelets: 280 10*3/uL (ref 150–450)
RBC: 4.09 x10E6/uL (ref 3.77–5.28)
RDW: 12.1 % (ref 11.7–15.4)
WBC: 6.2 10*3/uL (ref 3.4–10.8)

## 2019-04-30 LAB — TSH: TSH: 4.74 u[IU]/mL — ABNORMAL HIGH (ref 0.450–4.500)

## 2019-05-04 ENCOUNTER — Encounter: Payer: Self-pay | Admitting: Internal Medicine

## 2019-05-08 ENCOUNTER — Encounter: Payer: Self-pay | Admitting: Internal Medicine

## 2019-05-08 DIAGNOSIS — Z8742 Personal history of other diseases of the female genital tract: Secondary | ICD-10-CM

## 2019-06-11 ENCOUNTER — Telehealth: Payer: Self-pay | Admitting: Internal Medicine

## 2019-06-11 ENCOUNTER — Other Ambulatory Visit: Payer: Self-pay

## 2019-06-11 DIAGNOSIS — Z8619 Personal history of other infectious and parasitic diseases: Secondary | ICD-10-CM | POA: Insufficient documentation

## 2019-06-11 MED ORDER — VALACYCLOVIR HCL 1 G PO TABS: 1000.0000 mg | ORAL_TABLET | Freq: Two times a day (BID) | ORAL | 1 refills | Status: DC | PRN

## 2019-06-11 NOTE — Telephone Encounter (Signed)
Copied from CRM [id number]. Topic: General - Other >> Jun 11, 2019  9:47 AM Lyn Hollingshead D wrote: Reason for CRM: PT needs call back from a nurse regarding a medication she take in the past, she decline and appointment  / please advise

## 2019-06-11 NOTE — Telephone Encounter (Signed)
Patient requested RF on Valtrex for PRN cold sores.   Sent in Rx.   CM

## 2019-06-11 NOTE — Progress Notes (Signed)
Patient said she has been taking Valtrex PRN for fever blisters. Her current Rx from her previous PCP is expired and wants a new one called into [place] [facility].  Sent medication in and patient informed.   CM

## 2019-07-03 ENCOUNTER — Telehealth: Payer: Self-pay | Admitting: Internal Medicine

## 2019-07-03 NOTE — Telephone Encounter (Signed)
Called pt told her we could not send in abx. just in case she gets UTI while she is out of town . Told pt if she feels like she is getting a UTI while she is out of town to go to a UC. Pt verbalized understanding.  KP

## 2019-07-03 NOTE — Telephone Encounter (Signed)
Pt states she is going to [facility] on a train for 2 weeks.  Pt states she occasionally gets UTI, requesting abx to take with her "in case she gets a UTI". She is leaving Sunday.  Please advise  [id number], [place] - [address] AT [facility] [address] [facility] [address] Phone:  [phone]  Fax:  [phone]

## 2019-08-26 ENCOUNTER — Ambulatory Visit
Admission: RE | Admit: 2019-08-26 | Discharge: 2019-08-26 | Disposition: A | Discharge status: Home / Self Care | Payer: Medicare Other | Source: Ambulatory Visit | Attending: Internal Medicine | Admitting: Internal Medicine

## 2019-08-26 ENCOUNTER — Other Ambulatory Visit: Payer: Self-pay

## 2019-08-26 DIAGNOSIS — Z1231 Encounter for screening mammogram for malignant neoplasm of breast: Secondary | ICD-10-CM | POA: Diagnosis not present

## 2020-01-27 ENCOUNTER — Encounter: Payer: Self-pay | Admitting: Internal Medicine

## 2020-04-29 ENCOUNTER — Other Ambulatory Visit: Payer: Self-pay

## 2020-04-29 ENCOUNTER — Ambulatory Visit (INDEPENDENT_AMBULATORY_CARE_PROVIDER_SITE_OTHER): Payer: Medicare Other | Admitting: Internal Medicine

## 2020-04-29 ENCOUNTER — Encounter: Payer: Self-pay | Admitting: Internal Medicine

## 2020-04-29 VITALS — BP 130/72 | HR 74 | Temp 98.0°F | Ht 66.0 in | Wt 144.0 lb

## 2020-04-29 DIAGNOSIS — M25542 Pain in joints of left hand: Secondary | ICD-10-CM | POA: Insufficient documentation

## 2020-04-29 DIAGNOSIS — Z8742 Personal history of other diseases of the female genital tract: Secondary | ICD-10-CM

## 2020-04-29 DIAGNOSIS — M25541 Pain in joints of right hand: Secondary | ICD-10-CM | POA: Diagnosis not present

## 2020-04-29 DIAGNOSIS — F439 Reaction to severe stress, unspecified: Secondary | ICD-10-CM | POA: Diagnosis not present

## 2020-04-29 DIAGNOSIS — E782 Mixed hyperlipidemia: Secondary | ICD-10-CM | POA: Diagnosis not present

## 2020-04-29 DIAGNOSIS — Z1231 Encounter for screening mammogram for malignant neoplasm of breast: Secondary | ICD-10-CM

## 2020-04-29 DIAGNOSIS — E039 Hypothyroidism, unspecified: Secondary | ICD-10-CM | POA: Insufficient documentation

## 2020-04-29 DIAGNOSIS — K2100 Gastro-esophageal reflux disease with esophagitis, without bleeding: Secondary | ICD-10-CM

## 2020-04-29 DIAGNOSIS — E038 Other specified hypothyroidism: Secondary | ICD-10-CM

## 2020-04-29 DIAGNOSIS — Z23 Encounter for immunization: Secondary | ICD-10-CM

## 2020-04-29 NOTE — Patient Instructions (Signed)
Prevnar-20 pneumonia vaccine is needed 

## 2020-04-29 NOTE — Progress Notes (Signed)
Date:  04/29/2020   Name:  Cassandra Hamilton   DOB:  [birth year]   MRN:  [id number]   Chief Complaint: Annual Exam (Breast exam no pap)  Cassandra Hamilton is a 65 y.o. female who presents today for her Complete Annual Exam. She feels well. She reports exercising walking x2 days a week also yoga. She reports she is sleeping well. Breast complaints none.  Mammogram: 08/2019 DEXA: 12/2018 Osteopenia Pap smear: 07/2018 neg with cotesting Colonoscopy: 09/2010  Immunization History  Administered Date(s) Administered  . Fluad Quad(high Dose 65+) 01/01/2020  . Hepatitis A 10/27/2014  . Hepatitis A, Adult 10/27/2014, 05/05/2015  . Influenza,inj,Quad PF,6+ Mos 11/25/2018  . Influenza-Unspecified 10/24/2018  . PFIZER(Purple Top)SARS-COV-2 Vaccination 04/29/2019, 05/20/2019  . Tdap 04/08/2009  . Zoster 05/05/2015  . Zoster Recombinat (Shingrix) 11/23/2016, 12/17/2016, 12/09/2018    Gastroesophageal Reflux She reports no abdominal pain, no chest pain, no coughing or no wheezing. This is a recurrent problem. The problem occurs rarely. Pertinent negatives include no fatigue. She has tried a PPI for the symptoms.  Hyperlipidemia This is a chronic problem. Recent lipid tests were reviewed and are high (10 yr risk 5%). Associated symptoms include myalgias. Pertinent negatives include no chest pain or shortness of breath. Current antihyperlipidemic treatment includes diet change.  Finger pain - especially her right thumb - very swollen and painful.  She continues to use her hands but is becoming more limited.  She takes tumeric and uses [employer]. Stress - she is feeling anxious due to her husband's health.  Anytime he has the slightest complaint she gets palpitations.  She feels like she needs to see a counselor to help her cope with the situation better. Lab Results  Component Value Date   CREATININE 0.64 04/29/2019   BUN 9 04/29/2019   NA 142 04/29/2019   K 4.3 04/29/2019   CL 106 04/29/2019   CO2 24  04/29/2019   Lab Results  Component Value Date   CHOL 207 (H) 04/29/2019   HDL 43 04/29/2019   LDLCALC 141 (H) 04/29/2019   TRIG 128 04/29/2019   CHOLHDL 4.8 (H) 04/29/2019   Lab Results  Component Value Date   TSH 4.740 (H) 04/29/2019   No results found for: HGBA1C Lab Results  Component Value Date   WBC 6.2 04/29/2019   HGB 13.5 04/29/2019   HCT 40.0 04/29/2019   MCV 98 (H) 04/29/2019   PLT 280 04/29/2019   Lab Results  Component Value Date   ALT 15 04/29/2019   AST 17 04/29/2019   ALKPHOS 94 04/29/2019   BILITOT 0.2 04/29/2019     Review of Systems  Constitutional: Negative for chills, fatigue and fever.  HENT: Negative for congestion, hearing loss, tinnitus, trouble swallowing and voice change.   Eyes: Negative for visual disturbance.  Respiratory: Negative for cough, chest tightness, shortness of breath and wheezing.   Cardiovascular: Negative for chest pain, palpitations and leg swelling.  Gastrointestinal: Negative for abdominal pain, constipation, diarrhea and vomiting.  Endocrine: Negative for polydipsia and polyuria.  Genitourinary: Negative for dysuria, frequency, genital sores, vaginal bleeding and vaginal discharge.  Musculoskeletal: Positive for arthralgias and myalgias. Negative for gait problem and joint swelling.  Skin: Negative for color change and rash.  Neurological: Negative for dizziness, tremors, light-headedness and headaches.  Hematological: Negative for adenopathy. Does not bruise/bleed easily.  Psychiatric/Behavioral: Negative for dysphoric mood and sleep disturbance. The patient is nervous/anxious.     Patient Active Problem List   Diagnosis Date  Noted  . History of cold sores 06/11/2019  . Hx of abnormal cervical Pap smear 05/08/2019  . Carpal tunnel syndrome of left wrist 04/29/2019  . Mixed hyperlipidemia 12/25/2018  . Gastroesophageal reflux disease with esophagitis without hemorrhage 12/25/2018  . Primary osteoarthritis of both  hands 12/25/2018  . Tinnitus of both ears 12/06/2017    Allergies  Allergen Reactions  . Ciprofloxacin     Rash   . Latex Rash    Past Surgical History:  Procedure Laterality Date  . BREAST BIOPSY Left 2005   neg  . CESAREAN SECTION     x3    Social History   Tobacco Use  . Smoking status: Former Smoker    Packs/day: 0.50    Years: 30.00    Pack years: 15.00    Quit date: 1999    Years since quitting: 23.2  . Smokeless tobacco: Never Used  Vaping Use  . Vaping Use: Never used  Substance Use Topics  . Alcohol use: Yes    Alcohol/week: 2.0 - 4.0 standard drinks    Types: 2 - 4 Standard drinks or equivalent per week  . Drug use: Yes    Types: Marijuana    Comment: also does edible.      Medication list has been reviewed and updated.  Current Meds  Medication Sig  . Ascorbic Acid (VITAMIN C) 1000 MG tablet Take 1,000 mg by mouth daily.  Marland Kitchen b complex vitamins tablet Take 1 tablet by mouth daily.  . Boron 3 MG CAPS Take by mouth. Tablet  . Cholecalciferol 50 MCG (2000 UT) TABS Take by mouth.  Tilman Neat EXTRACT PO Take by mouth.  . estradiol (ESTRACE) 0.1 MG/GM vaginal cream as needed.  . Iodine, Kelp, (KELP PO) Take by mouth daily.  . magnesium 30 MG tablet Take 30 mg by mouth.   . nitrofurantoin, macrocrystal-monohydrate, (MACROBID) 100 MG capsule TAKE 1 CAPSULE BY MOUTH AFTER SEX AS NEEDED  . Turmeric 500 MG CAPS Take by mouth.  . valACYclovir (VALTREX) 1000 MG tablet Take 1 tablet (1,000 mg total) by mouth 2 (two) times daily as needed.  . [DISCONTINUED] calcium-vitamin D (OSCAL WITH D) 500-200 MG-UNIT tablet Take 1 tablet by mouth. Separate pill.  . [DISCONTINUED] esomeprazole (NEXIUM) 20 MG capsule Take 20 mg by mouth daily at 12 noon.    PHQ 2/9 Scores 04/29/2020 04/29/2019 04/08/2019 12/25/2018  PHQ - 2 Score 0 0 4 0  PHQ- 9 Score 0 0 9 -    GAD 7 : Generalized Anxiety Score 04/29/2020 04/29/2019  Nervous, Anxious, on Edge 1 1  Control/stop worrying 1 1   Worry too much - different things 1 1  Trouble relaxing 0 1  Restless 0 1  Easily annoyed or irritable 0 0  Afraid - awful might happen 0 1  Total GAD 7 Score 3 6  Anxiety Difficulty - Not difficult at all    BP Readings from Last 3 Encounters:  04/29/20 130/72  04/29/19 112/62  04/08/19 126/72    Physical Exam Vitals and nursing note reviewed.  Constitutional:      General: She is not in acute distress.    Appearance: She is well-developed.  HENT:     Head: Normocephalic and atraumatic.     Right Ear: Tympanic membrane and ear canal normal.     Left Ear: Tympanic membrane and ear canal normal.     Nose:     Right Sinus: No maxillary sinus tenderness.  Left Sinus: No maxillary sinus tenderness.  Eyes:     General: No scleral icterus.       Right eye: No discharge.        Left eye: No discharge.     Conjunctiva/sclera: Conjunctivae normal.  Neck:     Thyroid: No thyromegaly.     Vascular: No carotid bruit.  Cardiovascular:     Rate and Rhythm: Normal rate and regular rhythm.     Pulses: Normal pulses.     Heart sounds: Normal heart sounds.  Pulmonary:     Effort: Pulmonary effort is normal. No respiratory distress.     Breath sounds: No wheezing.  Abdominal:     General: Bowel sounds are normal.     Palpations: Abdomen is soft.     Tenderness: There is no abdominal tenderness.  Musculoskeletal:     Right hand: Swelling, deformity and bony tenderness present. Decreased range of motion.     Cervical back: Normal range of motion. No erythema.     Right lower leg: No edema.     Left lower leg: No edema.  Lymphadenopathy:     Cervical: No cervical adenopathy.  Skin:    General: Skin is warm and dry.     Findings: No rash.  Neurological:     Mental Status: She is alert and oriented to person, place, and time.     Cranial Nerves: No cranial nerve deficit.     Sensory: No sensory deficit.     Deep Tendon Reflexes: Reflexes are normal and symmetric.   Psychiatric:        Attention and Perception: Attention normal.        Mood and Affect: Mood normal.     Wt Readings from Last 3 Encounters:  04/29/20 144 lb (65.3 kg)  04/29/19 150 lb (68 kg)  04/08/19 152 lb (68.9 kg)    BP 130/72   Pulse 74   Temp 98 F (36.7 C) (Oral)   Ht 5\' 6"  (1.676 m)   Wt 144 lb (65.3 kg)   SpO2 98%   BMI 23.24 kg/m   Assessment and Plan: 1. Joint pain in both hands S/p mucus cyst removal in [place] Having more pain and limitation in thumb especially so will refer - Ambulatory referral to Orthopedic Surgery  2. Stress at home Consider counseling - providers given  3. Encounter for screening mammogram for breast cancer To be scheduled - MM 3D SCREEN BREAST BILATERAL; Future  4. Mixed hyperlipidemia Will check labs and advise Previous risk score below average - Lipid panel  5. Gastroesophageal reflux disease with esophagitis without hemorrhage No longer requiring daily treatment with nexium Using [facility] as needed - CBC with Differential/Platelet - Comprehensive metabolic panel  6. Need for vaccination for pneumococcus Will schedule a nurse visit  7. Subclinical hypothyroidism Followed by Dr. [facility]  8. Hx of abnormal cervical Pap smear Last done 2020 - due for repeat with GYN next year   Partially dictated using Dragon software. Any errors are unintentional.  2021, MD [facility]  04/29/2020

## 2020-04-30 ENCOUNTER — Encounter: Payer: Self-pay | Admitting: Internal Medicine

## 2020-04-30 LAB — CBC WITH DIFFERENTIAL/PLATELET
Basophils Absolute: 0.1 10*3/uL (ref 0.0–0.2)
Basos: 1 %
EOS (ABSOLUTE): 0.2 10*3/uL (ref 0.0–0.4)
Eos: 2 %
Hematocrit: 41.2 % (ref 34.0–46.6)
Hemoglobin: 14.1 g/dL (ref 11.1–15.9)
Immature Grans (Abs): 0 10*3/uL (ref 0.0–0.1)
Immature Granulocytes: 0 %
Lymphocytes Absolute: 2.7 10*3/uL (ref 0.7–3.1)
Lymphs: 36 %
MCH: 33.5 pg — ABNORMAL HIGH (ref 26.6–33.0)
MCHC: 34.2 g/dL (ref 31.5–35.7)
MCV: 98 fL — ABNORMAL HIGH (ref 79–97)
Monocytes Absolute: 0.6 10*3/uL (ref 0.1–0.9)
Monocytes: 9 %
Neutrophils Absolute: 3.9 10*3/uL (ref 1.4–7.0)
Neutrophils: 52 %
Platelets: 310 10*3/uL (ref 150–450)
RBC: 4.21 x10E6/uL (ref 3.77–5.28)
RDW: 11.8 % (ref 11.7–15.4)
WBC: 7.5 10*3/uL (ref 3.4–10.8)

## 2020-04-30 LAB — COMPREHENSIVE METABOLIC PANEL
ALT: 16 IU/L (ref 0–32)
AST: 19 IU/L (ref 0–40)
Albumin/Globulin Ratio: 2.3 — ABNORMAL HIGH (ref 1.2–2.2)
Albumin: 4.6 g/dL (ref 3.8–4.8)
Alkaline Phosphatase: 100 IU/L (ref 44–121)
BUN/Creatinine Ratio: 11 — ABNORMAL LOW (ref 12–28)
BUN: 7 mg/dL — ABNORMAL LOW (ref 8–27)
Bilirubin Total: 0.3 mg/dL (ref 0.0–1.2)
CO2: 22 mmol/L (ref 20–29)
Calcium: 9.3 mg/dL (ref 8.7–10.3)
Chloride: 106 mmol/L (ref 96–106)
Creatinine, Ser: 0.61 mg/dL (ref 0.57–1.00)
Globulin, Total: 2 g/dL (ref 1.5–4.5)
Glucose: 86 mg/dL (ref 65–99)
Potassium: 4.4 mmol/L (ref 3.5–5.2)
Sodium: 145 mmol/L — ABNORMAL HIGH (ref 134–144)
Total Protein: 6.6 g/dL (ref 6.0–8.5)
eGFR: 99 mL/min/{1.73_m2} (ref >59)

## 2020-04-30 LAB — LIPID PANEL
Chol/HDL Ratio: 5 ratio — ABNORMAL HIGH (ref 0.0–4.4)
Cholesterol, Total: 204 mg/dL — ABNORMAL HIGH (ref 100–199)
HDL: 41 mg/dL (ref >39)
LDL Chol Calc (NIH): 130 mg/dL — ABNORMAL HIGH (ref 0–99)
Triglycerides: 184 mg/dL — ABNORMAL HIGH (ref 0–149)
VLDL Cholesterol Cal: 33 mg/dL (ref 5–40)

## 2020-05-10 ENCOUNTER — Other Ambulatory Visit: Payer: Self-pay

## 2020-05-10 ENCOUNTER — Ambulatory Visit (INDEPENDENT_AMBULATORY_CARE_PROVIDER_SITE_OTHER): Payer: Medicare Other

## 2020-05-10 DIAGNOSIS — Z23 Encounter for immunization: Secondary | ICD-10-CM | POA: Diagnosis not present

## 2020-08-02 ENCOUNTER — Encounter: Payer: Self-pay | Admitting: Internal Medicine

## 2020-08-04 NOTE — Telephone Encounter (Signed)
Pt called stating that the medication is not working. Attempted to get pt scheduled for an appt, but PCP is booked until 08/11/20. Pt is requesting to have a call back with a sooner appt. Please advise.

## 2020-08-05 ENCOUNTER — Other Ambulatory Visit: Payer: Self-pay | Admitting: Internal Medicine

## 2020-08-05 ENCOUNTER — Ambulatory Visit (INDEPENDENT_AMBULATORY_CARE_PROVIDER_SITE_OTHER): Payer: Medicare Other | Admitting: Internal Medicine

## 2020-08-05 ENCOUNTER — Other Ambulatory Visit: Payer: Self-pay

## 2020-08-05 ENCOUNTER — Encounter: Payer: Self-pay | Admitting: Internal Medicine

## 2020-08-05 VITALS — BP 126/64 | HR 86 | Ht 66.0 in | Wt 145.0 lb

## 2020-08-05 DIAGNOSIS — N3001 Acute cystitis with hematuria: Secondary | ICD-10-CM | POA: Diagnosis not present

## 2020-08-05 LAB — POC URINALYSIS WITH MICROSCOPIC (NON AUTO)MANUAL RESULT
Bilirubin, UA: NEGATIVE
Blood, UA: NEGATIVE
Crystals: 0
Glucose, UA: NEGATIVE
Ketones, UA: NEGATIVE
Mucus, UA: 0
Nitrite, UA: NEGATIVE
Protein, UA: NEGATIVE
RBC: 0 M/uL — AB (ref 4.04–5.48)
Spec Grav, UA: <=1.005 — AB (ref 1.010–1.025)
Urobilinogen, UA: 0.2 E.U./dL
WBC Casts, UA: 100
pH, UA: 7.5 (ref 5.0–8.0)

## 2020-08-05 MED ORDER — SULFAMETHOXAZOLE-TRIMETHOPRIM 800-160 MG PO TABS: 1.0000 | ORAL_TABLET | Freq: Two times a day (BID) | ORAL | 0 refills | Status: AC

## 2020-08-05 NOTE — Progress Notes (Signed)
Date:  08/05/2020   Name:  Cassandra Hamilton   DOB:  [birth year]   MRN:  [id number]   Chief Complaint: Dysuria (Tried Macrobid for 5 days BID and not helping. )  Dysuria  This is a new problem. The current episode started in the past 7 days. The problem has been unchanged. The pain is mild. There has been no fever. Pertinent negatives include no chills, hematuria, nausea, urgency or vomiting. She has tried antibiotics (macrobid no benefit) for the symptoms.   Lab Results  Component Value Date   CREATININE 0.61 04/29/2020   BUN 7 (L) 04/29/2020   NA 145 (H) 04/29/2020   K 4.4 04/29/2020   CL 106 04/29/2020   CO2 22 04/29/2020   Lab Results  Component Value Date   CHOL 204 (H) 04/29/2020   HDL 41 04/29/2020   LDLCALC 130 (H) 04/29/2020   TRIG 184 (H) 04/29/2020   CHOLHDL 5.0 (H) 04/29/2020   Lab Results  Component Value Date   TSH 4.740 (H) 04/29/2019   No results found for: HGBA1C Lab Results  Component Value Date   WBC 7.5 04/29/2020   HGB 14.1 04/29/2020   HCT 41.2 04/29/2020   MCV 98 (H) 04/29/2020   PLT 310 04/29/2020   Lab Results  Component Value Date   ALT 16 04/29/2020   AST 19 04/29/2020   ALKPHOS 100 04/29/2020   BILITOT 0.3 04/29/2020     Review of Systems  Constitutional:  Negative for chills, fatigue and fever.  Respiratory:  Negative for cough, chest tightness and shortness of breath.   Cardiovascular:  Negative for chest pain.  Gastrointestinal:  Positive for abdominal pain. Negative for diarrhea, nausea and vomiting.  Genitourinary:  Positive for dysuria. Negative for hematuria and urgency.   Patient Active Problem List   Diagnosis Date Noted   Subclinical hypothyroidism 04/29/2020   Joint pain in both hands 04/29/2020   History of cold sores 06/11/2019   Hx of abnormal cervical Pap smear 05/08/2019   Carpal tunnel syndrome of left wrist 04/29/2019   Mixed hyperlipidemia 12/25/2018   Gastroesophageal reflux disease with esophagitis without  hemorrhage 12/25/2018   Primary osteoarthritis of both hands 12/25/2018   Tinnitus of both ears 12/06/2017    Allergies  Allergen Reactions   Ciprofloxacin     Rash    Latex Rash    Past Surgical History:  Procedure Laterality Date   BREAST BIOPSY Left 2005   neg   CESAREAN SECTION     x3    Social History   Tobacco Use   Smoking status: Former    Packs/day: 0.50    Years: 30.00    Pack years: 15.00    Types: Cigarettes    Quit date: 1999    Years since quitting: 23.5   Smokeless tobacco: Never  Vaping Use   Vaping Use: Never used  Substance Use Topics   Alcohol use: Yes    Alcohol/week: 2.0 - 4.0 standard drinks    Types: 2 - 4 Standard drinks or equivalent per week   Drug use: Yes    Types: Marijuana    Comment: also does edible.      Medication list has been reviewed and updated.  Current Meds  Medication Sig   Ascorbic Acid (VITAMIN C) 1000 MG tablet Take 1,000 mg by mouth daily.   b complex vitamins tablet Take 1 tablet by mouth daily.   Boron 3 MG CAPS Take by mouth. Tablet  Cholecalciferol 50 MCG (2000 UT) TABS Take by mouth.   CRANBERRY EXTRACT PO Take by mouth.   estradiol (ESTRACE) 0.1 MG/GM vaginal cream as needed.   Iodine, Kelp, (KELP PO) Take by mouth daily.   magnesium 30 MG tablet Take 30 mg by mouth.    nitrofurantoin, macrocrystal-monohydrate, (MACROBID) 100 MG capsule TAKE 1 CAPSULE BY MOUTH AFTER SEX AS NEEDED   Turmeric 500 MG CAPS Take by mouth.   valACYclovir (VALTREX) 1000 MG tablet Take 1 tablet (1,000 mg total) by mouth 2 (two) times daily as needed.    PHQ 2/9 Scores 08/05/2020 04/29/2020 04/29/2019 04/08/2019  PHQ - 2 Score 0 0 0 4  PHQ- 9 Score 2 0 0 9    GAD 7 : Generalized Anxiety Score 08/05/2020 04/29/2020 04/29/2019  Nervous, Anxious, on Edge 1 1 1   Control/stop worrying 0 1 1  Worry too much - different things 1 1 1   Trouble relaxing 0 0 1  Restless 0 0 1  Easily annoyed or irritable 0 0 0  Afraid - awful might happen 0  0 1  Total GAD 7 Score 2 3 6   Anxiety Difficulty Not difficult at all - Not difficult at all    BP Readings from Last 3 Encounters:  08/05/20 126/64  04/29/20 130/72  04/29/19 112/62    Physical Exam Vitals and nursing note reviewed.  Constitutional:      Appearance: Normal appearance. She is well-developed.  Cardiovascular:     Rate and Rhythm: Normal rate and regular rhythm.     Heart sounds: Normal heart sounds.  Pulmonary:     Effort: Pulmonary effort is normal. No respiratory distress.     Breath sounds: Normal breath sounds.  Abdominal:     General: Bowel sounds are normal.     Palpations: Abdomen is soft.     Tenderness: There is abdominal tenderness in the suprapubic area. There is no right CVA tenderness, left CVA tenderness, guarding or rebound.  Musculoskeletal:     Cervical back: Normal range of motion.  Neurological:     Mental Status: She is alert.    Wt Readings from Last 3 Encounters:  08/05/20 145 lb (65.8 kg)  04/29/20 144 lb (65.3 kg)  04/29/19 150 lb (68 kg)    BP 126/64 (BP Location: Right Arm, Patient Position: Sitting, Cuff Size: Normal)   Pulse 86   Ht 5\' 6"  (1.676 m)   Wt 145 lb (65.8 kg)   SpO2 99%   BMI 23.40 kg/m   Assessment and Plan: 1. Acute cystitis with hematuria Continue to push fluids Stop macrobid Will submit culture and begin presumptive treatment with Bactrim DS - POC urinalysis w microscopic (non auto) - Urine Culture - sulfamethoxazole-trimethoprim (BACTRIM DS) 800-160 MG tablet; Take 1 tablet by mouth 2 (two) times daily for 10 days.  Dispense: 20 tablet; Refill: 0   Partially dictated using 08/07/20. Any errors are unintentional.  06/29/20, MD [facility]  08/05/2020

## 2020-08-08 LAB — URINE CULTURE

## 2020-08-16 ENCOUNTER — Ambulatory Visit (INDEPENDENT_AMBULATORY_CARE_PROVIDER_SITE_OTHER): Payer: Medicare Other

## 2020-08-16 DIAGNOSIS — Z0000 Encounter for general adult medical examination without abnormal findings: Secondary | ICD-10-CM | POA: Diagnosis not present

## 2020-08-16 DIAGNOSIS — Z1211 Encounter for screening for malignant neoplasm of colon: Secondary | ICD-10-CM | POA: Diagnosis not present

## 2020-08-16 NOTE — Patient Instructions (Signed)
Cassandra Hamilton , Thank you for taking time to come for your Medicare Wellness Visit. I appreciate your ongoing commitment to your health goals. Please review the following plan we discussed and let me know if I can assist you in the future.   Screening recommendations/referrals: Colonoscopy: done 10/05/10. Referral sent to [facility] today for repeat screening colonoscopy. They will contact you for an appointment.  Mammogram: done 08/26/19. Scheduled for 08/26/20 Bone Density: done 01/09/19 Recommended yearly ophthalmology/optometry visit for glaucoma screening and checkup Recommended yearly dental visit for hygiene and checkup  Vaccinations: Influenza vaccine: done 01/01/20 Pneumococcal vaccine: done 05/10/20 Tdap vaccine: due Shingles vaccine: done 12/17/16 & 12/09/18   Covid-19:done 04/29/19 & 05/20/19  Advanced directives: Please bring a copy of your health care power of attorney and living will to the office at your convenience.   Conditions/risks identified: Keep up the great work!  Next appointment: Follow up in one year for your annual wellness visit    Preventive Care 65 Years and Older, Female Preventive care refers to lifestyle choices and visits with your health care provider that can promote health and wellness. What does preventive care include? A yearly physical exam. This is also called an annual well check. Dental exams once or twice a year. Routine eye exams. Ask your health care provider how often you should have your eyes checked. Personal lifestyle choices, including: Daily care of your teeth and gums. Regular physical activity. Eating a healthy diet. Avoiding tobacco and drug use. Limiting alcohol use. Practicing safe sex. Taking low-dose aspirin every day. Taking vitamin and mineral supplements as recommended by your health care provider. What happens during an annual well check? The services and screenings done by your health care provider during your  annual well check will depend on your age, overall health, lifestyle risk factors, and family history of disease. Counseling  Your health care provider may ask you questions about your: Alcohol use. Tobacco use. Drug use. Emotional well-being. Home and relationship well-being. Sexual activity. Eating habits. History of falls. Memory and ability to understand (cognition). Work and work Astronomer. Reproductive health. Screening  You may have the following tests or measurements: Height, weight, and BMI. Blood pressure. Lipid and cholesterol levels. These may be checked every 5 years, or more frequently if you are over 58 years old. Skin check. Lung cancer screening. You may have this screening every year starting at age 66 if you have a 30-pack-year history of smoking and currently smoke or have quit within the past 15 years. Fecal occult blood test (FOBT) of the stool. You may have this test every year starting at age 48. Flexible sigmoidoscopy or colonoscopy. You may have a sigmoidoscopy every 5 years or a colonoscopy every 10 years starting at age 25. Hepatitis C blood test. Hepatitis B blood test. Sexually transmitted disease (STD) testing. Diabetes screening. This is done by checking your blood sugar (glucose) after you have not eaten for a while (fasting). You may have this done every 1-3 years. Bone density scan. This is done to screen for osteoporosis. You may have this done starting at age 57. Mammogram. This may be done every 1-2 years. Talk to your health care provider about how often you should have regular mammograms. Talk with your health care provider about your test results, treatment options, and if necessary, the need for more tests. Vaccines  Your health care provider may recommend certain vaccines, such as: Influenza vaccine. This is recommended every year. Tetanus, diphtheria, and acellular pertussis (  Tdap, Td) vaccine. You may need a Td booster every 10  years. Zoster vaccine. You may need this after age 26. Pneumococcal 13-valent conjugate (PCV13) vaccine. One dose is recommended after age 67. Pneumococcal polysaccharide (PPSV23) vaccine. One dose is recommended after age 51. Talk to your health care provider about which screenings and vaccines you need and how often you need them. This information is not intended to replace advice given to you by your health care provider. Make sure you discuss any questions you have with your health care provider. Document Released: 02/05/2015 Document Revised: 09/29/2015 Document Reviewed: 11/10/2014 Elsevier Interactive Patient Education  2017 [employer] Prevention in the Home Falls can cause injuries. They can happen to people of all ages. There are many things you can do to make your home safe and to help prevent falls. What can I do on the outside of my home? Regularly fix the edges of walkways and driveways and fix any cracks. Remove anything that might make you trip as you walk through a door, such as a raised step or threshold. Trim any bushes or trees on the path to your home. Use bright outdoor lighting. Clear any walking paths of anything that might make someone trip, such as rocks or tools. Regularly check to see if handrails are loose or broken. Make sure that both sides of any steps have handrails. Any raised decks and porches should have guardrails on the edges. Have any leaves, snow, or ice cleared regularly. Use sand or salt on walking paths during winter. Clean up any spills in your garage right away. This includes oil or grease spills. What can I do in the bathroom? Use night lights. Install grab bars by the toilet and in the tub and shower. Do not use towel bars as grab bars. Use non-skid mats or decals in the tub or shower. If you need to sit down in the shower, use a plastic, non-slip stool. Keep the floor dry. Clean up any water that spills on the floor as soon as it  happens. Remove soap buildup in the tub or shower regularly. Attach bath mats securely with double-sided non-slip rug tape. Do not have throw rugs and other things on the floor that can make you trip. What can I do in the bedroom? Use night lights. Make sure that you have a light by your bed that is easy to reach. Do not use any sheets or blankets that are too big for your bed. They should not hang down onto the floor. Have a firm chair that has side arms. You can use this for support while you get dressed. Do not have throw rugs and other things on the floor that can make you trip. What can I do in the kitchen? Clean up any spills right away. Avoid walking on wet floors. Keep items that you use a lot in easy-to-reach places. If you need to reach something above you, use a strong step stool that has a grab bar. Keep electrical cords out of the way. Do not use floor polish or wax that makes floors slippery. If you must use wax, use non-skid floor wax. Do not have throw rugs and other things on the floor that can make you trip. What can I do with my stairs? Do not leave any items on the stairs. Make sure that there are handrails on both sides of the stairs and use them. Fix handrails that are broken or loose. Make sure that handrails are  as long as the stairways. Check any carpeting to make sure that it is firmly attached to the stairs. Fix any carpet that is loose or worn. Avoid having throw rugs at the top or bottom of the stairs. If you do have throw rugs, attach them to the floor with carpet tape. Make sure that you have a light switch at the top of the stairs and the bottom of the stairs. If you do not have them, ask someone to add them for you. What else can I do to help prevent falls? Wear shoes that: Do not have high heels. Have rubber bottoms. Are comfortable and fit you well. Are closed at the toe. Do not wear sandals. If you use a stepladder: Make sure that it is fully opened.  Do not climb a closed stepladder. Make sure that both sides of the stepladder are locked into place. Ask someone to hold it for you, if possible. Clearly mark and make sure that you can see: Any grab bars or handrails. First and last steps. Where the edge of each step is. Use tools that help you move around (mobility aids) if they are needed. These include: Canes. Walkers. Scooters. Crutches. Turn on the lights when you go into a dark area. Replace any light bulbs as soon as they burn out. Set up your furniture so you have a clear path. Avoid moving your furniture around. If any of your floors are uneven, fix them. If there are any pets around you, be aware of where they are. Review your medicines with your doctor. Some medicines can make you feel dizzy. This can increase your chance of falling. Ask your doctor what other things that you can do to help prevent falls. This information is not intended to replace advice given to you by your health care provider. Make sure you discuss any questions you have with your health care provider. Document Released: 11/05/2008 Document Revised: 06/17/2015 Document Reviewed: 02/13/2014 Elsevier Interactive Patient Education  2017 [employer].

## 2020-08-16 NOTE — Progress Notes (Signed)
Subjective:   Cassandra Hamilton is a 65 y.o. female who presents for an Initial Medicare Annual Wellness Visit.  Virtual Visit via Telephone Note  I connected with  [name] on 08/16/20 at 10:40 AM EDT by telephone and verified that I am speaking with the correct person using two identifiers.  Location: Patient: home Provider: [facility] Persons participating in the virtual visit: patient/Nurse Health Advisor   I discussed the limitations, risks, security and privacy concerns of performing an evaluation and management service by telephone and the availability of in person appointments. The patient expressed understanding and agreed to proceed.  Interactive audio and video telecommunications were attempted between this nurse and patient, however failed, due to patient having technical difficulties OR patient did not have access to video capability.  We continued and completed visit with audio only.  Some vital signs may be absent or patient reported.   Reather Littler, LPN   Review of Systems     Cardiac Risk Factors include: advanced age (>27men, >38 women)     Objective:    There were no vitals filed for this visit. There is no height or weight on file to calculate BMI.  Advanced Directives 08/16/2020 11/25/2018  Does Patient Have a Medical Advance Directive? Yes No  Type of Estate agent of [place];Living will;Out of facility DNR (pink MOST or yellow form) -  Copy of Healthcare Power of Attorney in Chart? No - copy requested -    Current Medications (verified) Outpatient Encounter Medications as of 08/16/2020  Medication Sig   Ascorbic Acid (VITAMIN C) 1000 MG tablet Take 1,000 mg by mouth daily.   b complex vitamins tablet Take 1 tablet by mouth daily.   Boron 3 MG CAPS Take by mouth. Tablet   Cholecalciferol 50 MCG (2000 UT) TABS Take by mouth.   CRANBERRY EXTRACT PO Take by mouth.   estradiol (ESTRACE) 0.1 MG/GM vaginal cream as needed.   Iodine, Kelp,  (KELP PO) Take by mouth daily.   magnesium 30 MG tablet Take 30 mg by mouth.    nitrofurantoin, macrocrystal-monohydrate, (MACROBID) 100 MG capsule TAKE 1 CAPSULE BY MOUTH AFTER SEX AS NEEDED   Probiotic Product (PROBIOTIC-10 PO) Take by mouth.   Turmeric 500 MG CAPS Take by mouth.   valACYclovir (VALTREX) 1000 MG tablet Take 1 tablet (1,000 mg total) by mouth 2 (two) times daily as needed.   No facility-administered encounter medications on file as of 08/16/2020.    Allergies (verified) Ciprofloxacin and Latex   History: Past Medical History:  Diagnosis Date   ADHD (attention deficit hyperactivity disorder)    GERD (gastroesophageal reflux disease)    Hyperthyroidism    Past Surgical History:  Procedure Laterality Date   BREAST BIOPSY Left 2005   neg   CESAREAN SECTION     x3   Family History  Problem Relation Age of Onset   Cancer Mother    Testicular cancer Father    Diabetes Maternal Grandmother    Breast cancer Neg Hx    Social History   Socioeconomic History   Marital status: Married    Spouse name: Not on file   Number of children: 3   Years of education: Not on file   Highest education level: Not on file  Occupational History   Not on file  Tobacco Use   Smoking status: Former    Packs/day: 0.50    Years: 30.00    Pack years: 15.00    Types: Cigarettes  Quit date: 1999    Years since quitting: 23.5   Smokeless tobacco: Never  Vaping Use   Vaping Use: Never used  Substance and Sexual Activity   Alcohol use: Yes    Alcohol/week: 2.0 - 4.0 standard drinks    Types: 2 - 4 Standard drinks or equivalent per week   Drug use: Yes    Types: Marijuana    Comment: also does edible.    Sexual activity: Yes  Other Topics Concern   Not on file  Social History Narrative   Pt eats plant based diet/vegan   Social Determinants of Health   Financial Resource Strain: Low Risk    Difficulty of Paying Living Expenses: Not hard at all  Food Insecurity: No  Food Insecurity   Worried About Programme researcher, broadcasting/film/video in the Last Year: Never true   Ran Out of Food in the Last Year: Never true  Transportation Needs: No Transportation Needs   Lack of Transportation (Medical): No   Lack of Transportation (Non-Medical): No  Physical Activity: Sufficiently Active   Days of Exercise per Week: 3 days   Minutes of Exercise per Session: 60 min  Stress: No Stress Concern Present   Feeling of Stress : Not at all  Social Connections: Moderately Isolated   Frequency of Communication with Friends and Family: More than three times a week   Frequency of Social Gatherings with Friends and Family: Three times a week   Attends Religious Services: Never   Active Member of Clubs or Organizations: No   Attends Engineer, structural: Never   Marital Status: Married    Tobacco Counseling Counseling given: Not Answered   Clinical Intake:  Pre-visit preparation completed: Yes  Pain : No/denies pain     Nutritional Risks: None Diabetes: No  How often do you need to have someone help you when you read instructions, pamphlets, or other written materials from your doctor or pharmacy?: 1 - Never    Interpreter Needed?: No  Information entered by :: Reather Littler LPN   Activities of Daily Living In your present state of health, do you have any difficulty performing the following activities: 08/16/2020 08/05/2020  Hearing? Y Y  Comment right hearing aids -  Vision? N N  Difficulty concentrating or making decisions? N N  Walking or climbing stairs? N N  Dressing or bathing? N N  Doing errands, shopping? N N  Preparing Food and eating ? N -  Using the Toilet? N -  In the past six months, have you accidently leaked urine? N -  Do you have problems with loss of bowel control? N -  Managing your Medications? N -  Managing your Finances? N -  Housekeeping or managing your Housekeeping? N -  Some recent data might be hidden    Patient Care  Team: Reubin Milan, MD as PCP - General (Internal Medicine) Sherlon Handing, MD as Consulting Physician (Internal Medicine) Ward, Elenora Fender, MD as Referring Physician (Obstetrics and Gynecology)  Indicate any recent Medical Services you may have received from other than [facility] providers in the past year (date may be approximate).     Assessment:   This is a routine wellness examination for [place].  Hearing/Vision screen Hearing Screening - Comments:: Pt wears hearing aid in right ear Vision Screening - Comments:: Annual vision screenings done by [facility]  Dietary issues and exercise activities discussed: Current Exercise Habits: Home exercise routine, Type of exercise: walking;yoga, Time (  Minutes): 60, Frequency (Times/Week): 3, Weekly Exercise (Minutes/Week): 180, Intensity: Mild, Exercise limited by: None identified   Goals Addressed             This Visit's Progress    Increase physical activity       Patient would like to increase physical activity to walking everyday and yoga at least twice per week        Depression Screen PHQ 2/9 Scores 08/16/2020 08/05/2020 04/29/2020 04/29/2019 04/08/2019 12/25/2018  PHQ - 2 Score 0 0 0 0 4 0  PHQ- 9 Score - 2 0 0 9 -    Fall Risk Fall Risk  08/16/2020 08/05/2020 04/29/2020 04/29/2019  Falls in the past year? 0 0 0 0  Number falls in past yr: 0 0 - 0  Injury with Fall? 0 0 - 0  Risk for fall due to : No Fall Risks - - No Fall Risks  Follow up Falls prevention discussed Falls evaluation completed Falls evaluation completed Falls evaluation completed    FALL RISK PREVENTION PERTAINING TO THE HOME:  Any stairs in or around the home? Yes  If so, are there any without handrails? No  Home free of loose throw rugs in walkways, pet beds, electrical cords, etc? Yes  Adequate lighting in your home to reduce risk of falls? Yes   ASSISTIVE DEVICES UTILIZED TO PREVENT FALLS:  Life alert? No  Use of a cane, walker or w/c? No   Grab bars in the bathroom? No  Shower chair or bench in shower? No  Elevated toilet seat or a handicapped toilet? No   TIMED UP AND GO:  Was the test performed? No .  Telephonic visit  Cognitive Function: Normal cognitive status assessed by direct observation by this Nurse Health Advisor. No abnormalities found.           Immunizations Immunization History  Administered Date(s) Administered   Fluad Quad(high Dose 65+) 01/01/2020   Hepatitis A 10/27/2014   Hepatitis A, Adult 10/27/2014, 05/05/2015   Influenza,inj,Quad PF,6+ Mos 11/25/2018   Influenza-Unspecified 10/24/2018   PFIZER(Purple Top)SARS-COV-2 Vaccination 04/29/2019, 05/20/2019   PNEUMOCOCCAL CONJUGATE-20 05/10/2020   Tdap 04/08/2009   Zoster Recombinat (Shingrix) 12/17/2016, 12/09/2018   Zoster, Live 05/05/2015    TDAP status: Due, Education has been provided regarding the importance of this vaccine. Advised may receive this vaccine at local pharmacy or Health Dept. Aware to provide a copy of the vaccination record if obtained from local pharmacy or Health Dept. Verbalized acceptance and understanding.  Flu Vaccine status: Up to date  Pneumococcal vaccine status: Up to date  Covid-19 vaccine status: Completed vaccines  Qualifies for Shingles Vaccine? Yes   Zostavax completed Yes   Shingrix Completed?: Yes  Screening Tests Health Maintenance  Topic Date Due   PNA vac Low Risk Adult (1 of 2 - PCV13) 08/02/2019   COVID-19 Vaccine (3 - Booster for Pfizer series) 10/20/2019   INFLUENZA VACCINE  08/23/2020   MAMMOGRAM  08/25/2020   COLONOSCOPY (Pts 45-2576yrs Insurance coverage will need to be confirmed)  10/04/2020   DEXA SCAN  Completed   Hepatitis C Screening  Completed   Zoster Vaccines- Shingrix  Completed   HPV VACCINES  Aged Out   TETANUS/TDAP  Discontinued    Health Maintenance  Health Maintenance Due  Topic Date Due   PNA vac Low Risk Adult (1 of 2 - PCV13) 08/02/2019   COVID-19 Vaccine (3 -  Booster for Pfizer series) 10/20/2019    Colorectal cancer screening:  Type of screening: Colonoscopy. Completed 10/05/10. Repeat every 10 years. Referral sent today to [facility].   Mammogram status: Completed 08/26/19. Repeat every year. Scheduled for 08/26/20  Bone Density status: Completed 01/09/19. Results reflect: Bone density results: OSTEOPENIA. Repeat every 2 years.  Lung Cancer Screening: (Low Dose CT Chest recommended if Age 58-80 years, 30 pack-year currently smoking OR have quit w/in 15years.) does not qualify.   Additional Screening:  Hepatitis C Screening: does qualify; Completed 05/05/15  Vision Screening: Recommended annual ophthalmology exams for early detection of glaucoma and other disorders of the eye. Is the patient up to date with their annual eye exam?  Yes  Who is the provider or what is the name of the office in which the patient attends annual eye exams? [facility].   Dental Screening: Recommended annual dental exams for proper oral hygiene  Community Resource Referral / Chronic Care Management: CRR required this visit?  No   CCM required this visit?  No      Plan:     I have personally reviewed and noted the following in the patient's chart:   Medical and social history Use of alcohol, tobacco or illicit drugs  Current medications and supplements including opioid prescriptions. Patient is not currently taking opioid prescriptions. Functional ability and status Nutritional status Physical activity Advanced directives List of other physicians Hospitalizations, surgeries, and ER visits in previous 12 months Vitals Screenings to include cognitive, depression, and falls Referrals and appointments  In addition, I have reviewed and discussed with patient certain preventive protocols, quality metrics, and best practice recommendations. A written personalized care plan for preventive services as well as general preventive health recommendations were  provided to patient.     Reather Littler, LPN   6/00/4599   Nurse Notes: none

## 2020-08-17 ENCOUNTER — Other Ambulatory Visit: Payer: Self-pay

## 2020-08-17 MED ORDER — CLENPIQ 10-3.5-12 MG-GM -GM/160ML PO SOLN: 320.0000 mL | ORAL | 0 refills | Status: DC

## 2020-08-24 ENCOUNTER — Encounter: Payer: Self-pay | Admitting: Internal Medicine

## 2020-08-25 ENCOUNTER — Other Ambulatory Visit: Payer: Self-pay

## 2020-08-25 MED ORDER — CLENPIQ 10-3.5-12 MG-GM -GM/160ML PO SOLN: 1.0000 | Freq: Once | ORAL | 0 refills | Status: AC

## 2020-08-25 NOTE — Progress Notes (Signed)
Patient requested for prep to be sent to different pharmacy. Sent to [facility] in [place].

## 2020-08-26 ENCOUNTER — Other Ambulatory Visit: Payer: Self-pay

## 2020-08-26 ENCOUNTER — Ambulatory Visit
Admission: RE | Admit: 2020-08-26 | Discharge: 2020-08-26 | Disposition: A | Discharge status: Home / Self Care | Payer: Medicare Other | Source: Ambulatory Visit | Attending: Internal Medicine | Admitting: Internal Medicine

## 2020-08-26 DIAGNOSIS — Z1231 Encounter for screening mammogram for malignant neoplasm of breast: Secondary | ICD-10-CM | POA: Insufficient documentation

## 2020-11-02 ENCOUNTER — Encounter: Payer: Self-pay | Admitting: Gastroenterology

## 2020-11-19 ENCOUNTER — Other Ambulatory Visit: Payer: Self-pay

## 2020-11-19 ENCOUNTER — Ambulatory Visit: Payer: Medicare Other | Admitting: Anesthesiology

## 2020-11-19 ENCOUNTER — Ambulatory Visit
Admission: RE | Admit: 2020-11-19 | Discharge: 2020-11-19 | Disposition: A | Discharge status: Home / Self Care | Payer: Medicare Other | Attending: Gastroenterology | Admitting: Gastroenterology

## 2020-11-19 ENCOUNTER — Encounter
Admission: RE | Disposition: A | Discharge status: Home / Self Care | Payer: Self-pay | Source: Home / Self Care | Attending: Gastroenterology

## 2020-11-19 ENCOUNTER — Encounter: Payer: Self-pay | Admitting: Gastroenterology

## 2020-11-19 DIAGNOSIS — Z79899 Other long term (current) drug therapy: Secondary | ICD-10-CM | POA: Diagnosis not present

## 2020-11-19 DIAGNOSIS — Z833 Family history of diabetes mellitus: Secondary | ICD-10-CM | POA: Insufficient documentation

## 2020-11-19 DIAGNOSIS — Z809 Family history of malignant neoplasm, unspecified: Secondary | ICD-10-CM | POA: Insufficient documentation

## 2020-11-19 DIAGNOSIS — Z8043 Family history of malignant neoplasm of testis: Secondary | ICD-10-CM | POA: Insufficient documentation

## 2020-11-19 DIAGNOSIS — Z881 Allergy status to other antibiotic agents status: Secondary | ICD-10-CM | POA: Insufficient documentation

## 2020-11-19 DIAGNOSIS — Z1211 Encounter for screening for malignant neoplasm of colon: Secondary | ICD-10-CM | POA: Diagnosis present

## 2020-11-19 DIAGNOSIS — Z91040 Latex allergy status: Secondary | ICD-10-CM | POA: Insufficient documentation

## 2020-11-19 DIAGNOSIS — Z87891 Personal history of nicotine dependence: Secondary | ICD-10-CM | POA: Diagnosis not present

## 2020-11-19 HISTORY — PX: COLONOSCOPY: SHX5424

## 2020-11-19 HISTORY — DX: Disorder of thyroid, unspecified: E07.9

## 2020-11-19 SURGERY — COLONOSCOPY
Anesthesia: General

## 2020-11-19 MED ORDER — ACETAMINOPHEN 160 MG/5ML PO SOLN: 325.0000 mg | Freq: Once | ORAL | Status: DC

## 2020-11-19 MED ORDER — PROPOFOL 10 MG/ML IV BOLUS
INTRAVENOUS | Status: DC | PRN
  Administered 2020-11-19: 60 mg via INTRAVENOUS
  Administered 2020-11-19 (×3): 30 mg via INTRAVENOUS

## 2020-11-19 MED ORDER — LACTATED RINGERS IV SOLN: INTRAVENOUS | Status: DC

## 2020-11-19 MED ORDER — LIDOCAINE HCL (CARDIAC) PF 100 MG/5ML IV SOSY
PREFILLED_SYRINGE | INTRAVENOUS | Status: DC | PRN
  Administered 2020-11-19: 40 mg via INTRAVENOUS

## 2020-11-19 MED ORDER — ACETAMINOPHEN 325 MG PO TABS
325.0000 mg | ORAL_TABLET | Freq: Once | ORAL | Status: DC
Start: 2020-11-19 — End: 2020-11-19

## 2020-11-19 MED ORDER — SODIUM CHLORIDE 0.9 % IV SOLN: INTRAVENOUS | Status: DC

## 2020-11-19 MED ORDER — STERILE WATER FOR IRRIGATION IR SOLN
Status: DC | PRN
  Administered 2020-11-19: 1

## 2020-11-19 SURGICAL SUPPLY — 6 items
GOWN CVR UNV OPN BCK APRN NK (MISCELLANEOUS) ×2 IMPLANT
GOWN ISOL THUMB LOOP REG UNIV (MISCELLANEOUS) ×4
KIT PRC NS LF DISP ENDO (KITS) ×1 IMPLANT
KIT PROCEDURE OLYMPUS (KITS) ×2
MANIFOLD NEPTUNE II (INSTRUMENTS) ×2 IMPLANT
WATER STERILE IRR 250ML POUR (IV SOLUTION) ×2 IMPLANT

## 2020-11-19 NOTE — Anesthesia Procedure Notes (Signed)
Date/Time: 11/19/2020 8:56 AM Performed by: Lily Kocher, CRNA Pre-anesthesia Checklist: Patient identified, Emergency Drugs available, Suction available, Patient being monitored and Timeout performed Patient Re-evaluated:Patient Re-evaluated prior to induction Oxygen Delivery Method: Nasal cannula Placement Confirmation: positive ETCO2

## 2020-11-19 NOTE — Transfer of Care (Signed)
Immediate Anesthesia Transfer of Care Note  Patient: Cassandra Hamilton  Procedure(s) Performed: COLONOSCOPY  Patient Location: PACU  Anesthesia Type: General  Level of Consciousness: awake, alert  and patient cooperative  Airway and Oxygen Therapy: Patient Spontanous Breathing and Patient connected to supplemental oxygen  Post-op Assessment: Post-op Vital signs reviewed, Patient's Cardiovascular Status Stable, Respiratory Function Stable, Patent Airway and No signs of Nausea or vomiting  Post-op Vital Signs: Reviewed and stable  Complications: No notable events documented.

## 2020-11-19 NOTE — H&P (Signed)
Midge Minium, MD [facility] [address] [place], [place] [address] Phone: [phone] Fax : [phone]  Primary Care Physician:  Reubin Milan, MD Primary Gastroenterologist:  Dr. Servando Snare  Pre-Procedure History & Physical: HPI:  Cassandra Hamilton is a 65 y.o. female is here for a screening colonoscopy.   Past Medical History:  Diagnosis Date   ADHD (attention deficit hyperactivity disorder)    GERD (gastroesophageal reflux disease)    Hyperthyroidism    Thyroid disease     Past Surgical History:  Procedure Laterality Date   BREAST BIOPSY Left 2005   neg   CESAREAN SECTION     x3    Prior to Admission medications   Medication Sig Start Date End Date Taking? Authorizing Provider  Ascorbic Acid (VITAMIN C) 1000 MG tablet Take 1,000 mg by mouth daily.   Yes [provider]  b complex vitamins tablet Take 1 tablet by mouth daily.   Yes [provider]  Boron 3 MG CAPS Take by mouth. Tablet   Yes [provider]  Cholecalciferol 50 MCG (2000 UT) TABS Take by mouth.   Yes [provider]  CRANBERRY EXTRACT PO Take by mouth.   Yes [provider]  estradiol (ESTRACE) 0.1 MG/GM vaginal cream as needed. 07/04/19  Yes [provider]  Iodine, Kelp, (KELP PO) Take by mouth daily.   Yes [provider]  nitrofurantoin, macrocrystal-monohydrate, (MACROBID) 100 MG capsule TAKE 1 CAPSULE BY MOUTH AFTER SEX AS NEEDED 07/04/19  Yes [provider]  Probiotic Product (PROBIOTIC-10 PO) Take by mouth.   Yes [provider]  Turmeric 500 MG CAPS Take by mouth.   Yes [provider]  valACYclovir (VALTREX) 1000 MG tablet Take 1 tablet (1,000 mg total) by mouth 2 (two) times daily as needed. 06/11/19  Yes Reubin Milan, MD  magnesium 30 MG tablet Take 30 mg by mouth.  Patient not taking: Reported on 11/02/2020    [provider]    Allergies as of 08/17/2020 - Review Complete 08/16/2020   Allergen Reaction Noted   Ciprofloxacin  11/25/2018   Latex Rash 11/07/2012    Family History  Problem Relation Age of Onset   Cancer Mother    Testicular cancer Father    Diabetes Maternal Grandmother    Breast cancer Neg Hx     Social History   Socioeconomic History   Marital status: Married    Spouse name: Not on file   Number of children: 3   Years of education: Not on file   Highest education level: Not on file  Occupational History   Not on file  Tobacco Use   Smoking status: Former    Packs/day: 0.50    Years: 30.00    Pack years: 15.00    Types: Cigarettes    Quit date: 1999    Years since quitting: 23.8   Smokeless tobacco: Never  Vaping Use   Vaping Use: Never used  Substance and Sexual Activity   Alcohol use: Yes    Alcohol/week: 2.0 - 4.0 standard drinks    Types: 2 - 4 Standard drinks or equivalent per week   Drug use: Yes    Types: Marijuana    Comment: also does edible.    Sexual activity: Yes  Other Topics Concern   Not on file  Social History Narrative   Pt eats plant based diet/vegan   Social Determinants of Health   Financial Resource Strain: Low Risk  Difficulty of Paying Living Expenses: Not hard at all  Food Insecurity: No Food Insecurity   Worried About Running Out of Food in the Last Year: Never true   Ran Out of Food in the Last Year: Never true  Transportation Needs: No Transportation Needs   Lack of Transportation (Medical): No   Lack of Transportation (Non-Medical): No  Physical Activity: Sufficiently Active   Days of Exercise per Week: 3 days   Minutes of Exercise per Session: 60 min  Stress: No Stress Concern Present   Feeling of Stress : Not at all  Social Connections: Moderately Isolated   Frequency of Communication with Friends and Family: More than three times a week   Frequency of Social Gatherings with Friends and Family: Three times a week   Attends Religious Services: Never   Active Member of Clubs or  Organizations: No   Attends Banker Meetings: Never   Marital Status: Married  Catering manager Violence: Not At Risk   Fear of Current or Ex-Partner: No   Emotionally Abused: No   Physically Abused: No   Sexually Abused: No    Review of Systems: See HPI, otherwise negative ROS  Physical Exam: BP 138/86   Pulse 83   Temp (!) 97.3 F (36.3 C)   Ht 5\' 6"  (1.676 m)   Wt 63 kg   SpO2 99%   BMI 22.44 kg/m  General:   Alert,  pleasant and cooperative in NAD Head:  Normocephalic and atraumatic. Neck:  Supple; no masses or thyromegaly. Lungs:  Clear throughout to auscultation.    Heart:  Regular rate and rhythm. Abdomen:  Soft, nontender and nondistended. Normal bowel sounds, without guarding, and without rebound.   Neurologic:  Alert and  oriented x4;  grossly normal neurologically.  Impression/Plan: Cassandra Hamilton is now here to undergo a screening colonoscopy.  Risks, benefits, and alternatives regarding colonoscopy have been reviewed with the patient.  Questions have been answered.  All parties agreeable.

## 2020-11-19 NOTE — Anesthesia Postprocedure Evaluation (Signed)
Anesthesia Post Note  Patient: Cassandra Hamilton  Procedure(s) Performed: COLONOSCOPY     Patient location during evaluation: PACU Anesthesia Type: General Level of consciousness: awake and alert and oriented Pain management: satisfactory to patient Vital Signs Assessment: post-procedure vital signs reviewed and stable Respiratory status: spontaneous breathing, nonlabored ventilation and respiratory function stable Cardiovascular status: blood pressure returned to baseline and stable Postop Assessment: Adequate PO intake and No signs of nausea or vomiting Anesthetic complications: no   No notable events documented.  [place]

## 2020-11-19 NOTE — Op Note (Signed)
[facility] Gastroenterology Patient Name: Cassandra Hamilton Procedure Date: 11/19/2020 8:52 AM MRN: [id number] Account #: [id number] Date of Birth: 02-Jan-1956 Admit Type: Outpatient Age: 65 Room: [facility] OR ROOM 01 Gender: Female Note Status: Finalized Instrument Name: [id number] Procedure:             Colonoscopy Indications:           Screening for colorectal malignant neoplasm Providers:             Midge Minium MD, MD Referring MD:          Bari Edward, MD (Referring MD) Medicines:             Propofol per Anesthesia Complications:         No immediate complications. Procedure:             Pre-Anesthesia Assessment:                        - Prior to the procedure, a History and Physical was                         performed, and patient medications and allergies were                         reviewed. The patient's tolerance of previous                         anesthesia was also reviewed. The risks and benefits                         of the procedure and the sedation options and risks                         were discussed with the patient. All questions were                         answered, and informed consent was obtained. Prior                         Anticoagulants: The patient has taken no previous                         anticoagulant or antiplatelet agents. ASA Grade                         Assessment: II - A patient with mild systemic disease.                         After reviewing the risks and benefits, the patient                         was deemed in satisfactory condition to undergo the                         procedure.                        After obtaining informed consent, the colonoscope was  passed under direct vision. Throughout the procedure,                         the patient's blood pressure, pulse, and oxygen                         saturations were monitored continuously. The                         Colonoscope  was introduced through the anus and                         advanced to the the cecum, identified by appendiceal                         orifice and ileocecal valve. The colonoscopy was                         performed without difficulty. The patient tolerated                         the procedure well. The quality of the bowel                         preparation was excellent. Findings:      The perianal and digital rectal examinations were normal.      The colon (entire examined portion) appeared normal. Impression:            - The entire examined colon is normal.                        - No specimens collected. Recommendation:        - Discharge patient to home.                        - Resume previous diet.                        - Continue present medications.                        - Repeat colonoscopy in 10 years for screening                         purposes. Procedure Code(s):     --- Professional ---                        [phone], Colonoscopy, flexible; diagnostic, including                         collection of specimen(s) by brushing or washing, when                         performed (separate procedure) Diagnosis Code(s):     --- Professional ---                        Z12.11, Encounter for screening for malignant neoplasm  of colon CPT copyright 2019 American Medical Association. All rights reserved. The codes documented in this report are preliminary and upon coder review may  be revised to meet current compliance requirements. Midge Minium MD, MD 11/19/2020 9:10:51 AM This report has been signed electronically. Number of Addenda: 0 Note Initiated On: 11/19/2020 8:52 AM Scope Withdrawal Time: 0 hours 6 minutes 36 seconds  Total Procedure Duration: 0 hours 9 minutes 9 seconds  Estimated Blood Loss:  Estimated blood loss: none.      [facility]

## 2020-11-19 NOTE — Anesthesia Preprocedure Evaluation (Signed)
Anesthesia Evaluation  Patient identified by MRN, date of birth, ID band Patient awake    Reviewed: Allergy & Precautions, H&P , NPO status , Patient's Chart, lab work & pertinent test results  Airway Mallampati: II  TM Distance: >3 FB Neck ROM: full    Dental no notable dental hx.    Pulmonary former smoker,    Pulmonary exam normal breath sounds clear to auscultation       Cardiovascular Normal cardiovascular exam Rhythm:regular Rate:Normal     Neuro/Psych PSYCHIATRIC DISORDERS    GI/Hepatic GERD  ,  Endo/Other  Hyperthyroidism   Renal/GU      Musculoskeletal   Abdominal   Peds  Hematology   Anesthesia Other Findings   Reproductive/Obstetrics                             Anesthesia Physical Anesthesia Plan  ASA: 2  Anesthesia Plan: General   Post-op Pain Management:    Induction: Intravenous  PONV Risk Score and Plan: 3 and Treatment may vary due to age or medical condition, Propofol infusion and TIVA  Airway Management Planned: Natural Airway  Additional Equipment:   Intra-op Plan:   Post-operative Plan:   Informed Consent: I have reviewed the patients History and Physical, chart, labs and discussed the procedure including the risks, benefits and alternatives for the proposed anesthesia with the patient or authorized representative who has indicated his/her understanding and acceptance.     Dental Advisory Given  Plan Discussed with: CRNA  Anesthesia Plan Comments:         Anesthesia Quick Evaluation

## 2020-11-22 ENCOUNTER — Encounter: Payer: Self-pay | Admitting: Gastroenterology

## 2021-01-10 ENCOUNTER — Other Ambulatory Visit: Payer: Self-pay

## 2021-01-10 ENCOUNTER — Ambulatory Visit (INDEPENDENT_AMBULATORY_CARE_PROVIDER_SITE_OTHER): Payer: Medicare Other | Admitting: Internal Medicine

## 2021-01-10 ENCOUNTER — Encounter: Payer: Self-pay | Admitting: Internal Medicine

## 2021-01-10 VITALS — BP 130/76 | HR 86 | Ht 66.0 in | Wt 145.8 lb

## 2021-01-10 DIAGNOSIS — B009 Herpesviral infection, unspecified: Secondary | ICD-10-CM | POA: Diagnosis not present

## 2021-01-10 DIAGNOSIS — Z23 Encounter for immunization: Secondary | ICD-10-CM

## 2021-01-10 DIAGNOSIS — H9313 Tinnitus, bilateral: Secondary | ICD-10-CM | POA: Diagnosis not present

## 2021-01-10 MED ORDER — VALACYCLOVIR HCL 1 G PO TABS: 1000.0000 mg | ORAL_TABLET | Freq: Two times a day (BID) | ORAL | 0 refills | Status: AC | PRN

## 2021-01-10 NOTE — Progress Notes (Signed)
Date:  01/10/2021   Name:  Cassandra Hamilton   DOB:  05/12/1955   MRN:  [id number]   Chief Complaint: No chief complaint on file.  Mouth Lesions  The current episode started more than 1 week ago. The problem occurs occasionally. The problem is mild. Associated symptoms include hearing loss and mouth sores. Pertinent negatives include no fever, no double vision, no photophobia, no congestion, no headaches, no wheezing, no eye discharge and no eye pain.   Lab Results  Component Value Date   NA 145 (H) 04/29/2020   K 4.4 04/29/2020   CO2 22 04/29/2020   GLUCOSE 86 04/29/2020   BUN 7 (L) 04/29/2020   CREATININE 0.61 04/29/2020   CALCIUM 9.3 04/29/2020   EGFR 99 04/29/2020   GFRNONAA 95 04/29/2019   Lab Results  Component Value Date   CHOL 204 (H) 04/29/2020   HDL 41 04/29/2020   LDLCALC 130 (H) 04/29/2020   TRIG 184 (H) 04/29/2020   CHOLHDL 5.0 (H) 04/29/2020   Lab Results  Component Value Date   TSH 4.740 (H) 04/29/2019   No results found for: HGBA1C Lab Results  Component Value Date   WBC 7.5 04/29/2020   HGB 14.1 04/29/2020   HCT 41.2 04/29/2020   MCV 98 (H) 04/29/2020   PLT 310 04/29/2020   Lab Results  Component Value Date   ALT 16 04/29/2020   AST 19 04/29/2020   ALKPHOS 100 04/29/2020   BILITOT 0.3 04/29/2020   No results found for: 25OHVITD2, 25OHVITD3, VD25OH   Review of Systems  Constitutional:  Negative for chills, fatigue and fever.  HENT:  Positive for hearing loss, mouth sores and tinnitus. Negative for congestion.   Eyes:  Negative for double vision, photophobia, pain and discharge.  Respiratory:  Negative for shortness of breath and wheezing.   Cardiovascular:  Negative for chest pain and palpitations.  Neurological:  Negative for dizziness, light-headedness and headaches.  Psychiatric/Behavioral:  Negative for dysphoric mood and sleep disturbance. The patient is not nervous/anxious.    Patient Active Problem List   Diagnosis Date Noted    Special screening for malignant neoplasms, colon    Subclinical hypothyroidism 04/29/2020   Joint pain in both hands 04/29/2020   History of cold sores 06/11/2019   Hx of abnormal cervical Pap smear 05/08/2019   Carpal tunnel syndrome of left wrist 04/29/2019   Mixed hyperlipidemia 12/25/2018   Gastroesophageal reflux disease with esophagitis without hemorrhage 12/25/2018   Primary osteoarthritis of both hands 12/25/2018   Tinnitus of both ears 12/06/2017    Allergies  Allergen Reactions   Ciprofloxacin     Rash    Latex Rash    Past Surgical History:  Procedure Laterality Date   BREAST BIOPSY Left 2005   neg   CESAREAN SECTION     x3   COLONOSCOPY N/A 11/19/2020   Procedure: COLONOSCOPY;  Surgeon: Lucilla Lame, MD;  Location: [facility];  Service: Endoscopy;  Laterality: N/A;    Social History   Tobacco Use   Smoking status: Former    Packs/day: 0.50    Years: 30.00    Pack years: 15.00    Types: Cigarettes    Quit date: 1999    Years since quitting: 23.9   Smokeless tobacco: Never  Vaping Use   Vaping Use: Never used  Substance Use Topics   Alcohol use: Yes    Alcohol/week: 2.0 - 4.0 standard drinks    Types: 2 - 4 Standard  drinks or equivalent per week   Drug use: Yes    Types: Marijuana    Comment: also does edible.      Medication list has been reviewed and updated.  Current Meds  Medication Sig   Ascorbic Acid (VITAMIN C) 1000 MG tablet Take 1,000 mg by mouth daily.   b complex vitamins tablet Take 1 tablet by mouth daily.   Boron 3 MG CAPS Take by mouth. Tablet   Cholecalciferol 50 MCG (2000 UT) TABS Take by mouth.   CRANBERRY EXTRACT PO Take by mouth.   estradiol (ESTRACE) 0.1 MG/GM vaginal cream as needed.   Iodine, Kelp, (KELP PO) Take by mouth daily.   nitrofurantoin, macrocrystal-monohydrate, (MACROBID) 100 MG capsule TAKE 1 CAPSULE BY MOUTH AFTER SEX AS NEEDED   Probiotic Product (PROBIOTIC-10 PO) Take by mouth.   Turmeric 500 MG  CAPS Take by mouth.   valACYclovir (VALTREX) 1000 MG tablet Take 1 tablet (1,000 mg total) by mouth 2 (two) times daily as needed.    PHQ 2/9 Scores 01/10/2021 08/16/2020 08/05/2020 04/29/2020  PHQ - 2 Score 0 0 0 0  PHQ- 9 Score 0 - 2 0    GAD 7 : Generalized Anxiety Score 01/10/2021 08/05/2020 04/29/2020 04/29/2019  Nervous, Anxious, on Edge 0 _0 Control/stop worrying 0 0 1 1  Worry too much - different things 0 _1 Trouble relaxing 0 0 0 1  Restless 0 0 0 1  Easily annoyed or irritable 0 0 0 0  Afraid - awful might happen 0 0 0 1  Total GAD 7 Score 0 _2 Anxiety Difficulty Not difficult at all Not difficult at all - Not difficult at all    BP Readings from Last 3 Encounters:  01/10/21 130/76  11/19/20 104/71  08/05/20 126/64    Physical Exam Vitals and nursing note reviewed.  Constitutional:      General: She is not in acute distress.    Appearance: Normal appearance. She is well-developed.  HENT:     Head: Normocephalic and atraumatic.  Cardiovascular:     Rate and Rhythm: Normal rate and regular rhythm.     Pulses: Normal pulses.  Pulmonary:     Effort: Pulmonary effort is normal. No respiratory distress.     Breath sounds: No wheezing or rhonchi.  Musculoskeletal:     Right lower leg: No edema.     Left lower leg: No edema.  Lymphadenopathy:     Cervical: No cervical adenopathy.  Skin:    General: Skin is warm and dry.     Findings: No lesion or rash.  Neurological:     Mental Status: She is alert and oriented to person, place, and time.  Psychiatric:        Mood and Affect: Mood normal.        Behavior: Behavior normal.    Wt Readings from Last 3 Encounters:  01/10/21 145 lb 12.8 oz (66.1 kg)  11/19/20 139 lb (63 kg)  08/05/20 145 lb (65.8 kg)    BP 130/76    Pulse 86    Ht 5' 6" (1.676 m)    Wt 145 lb 12.8 oz (66.1 kg)    SpO2 98%    BMI 23.53 kg/m   Assessment and Plan: 1. Herpes simplex virus (HSV) infection Having flare ups several times  per year. These respond well the Valtrex 2000 mg bid x 1 day - valACYclovir (VALTREX) 1000 MG tablet; Take  1 tablet (1,000 mg total) by mouth 2 (two) times daily as needed.  Dispense: 40 tablet; Refill: 0  2. Need for immunization against influenza - Flu Vaccine QUAD High Dose(Fluad)  3. Tinnitus of both ears Wears hearing aids and needs them replaced. Recommend [employer] or any [employer]. Any errors are unintentional.  Halina Maidens, MD [facility]  01/10/2021

## 2021-05-01 ENCOUNTER — Other Ambulatory Visit: Payer: Self-pay | Admitting: Internal Medicine

## 2021-05-02 ENCOUNTER — Encounter: Payer: Self-pay | Admitting: Internal Medicine

## 2021-05-02 ENCOUNTER — Ambulatory Visit (INDEPENDENT_AMBULATORY_CARE_PROVIDER_SITE_OTHER): Payer: Medicare Other | Admitting: Internal Medicine

## 2021-05-02 VITALS — BP 112/70 | HR 76 | Ht 66.0 in | Wt 147.0 lb

## 2021-05-02 DIAGNOSIS — E559 Vitamin D deficiency, unspecified: Secondary | ICD-10-CM

## 2021-05-02 DIAGNOSIS — Z1231 Encounter for screening mammogram for malignant neoplasm of breast: Secondary | ICD-10-CM | POA: Diagnosis not present

## 2021-05-02 DIAGNOSIS — Z13820 Encounter for screening for osteoporosis: Secondary | ICD-10-CM

## 2021-05-02 DIAGNOSIS — K2100 Gastro-esophageal reflux disease with esophagitis, without bleeding: Secondary | ICD-10-CM

## 2021-05-02 DIAGNOSIS — E782 Mixed hyperlipidemia: Secondary | ICD-10-CM

## 2021-05-02 DIAGNOSIS — N390 Urinary tract infection, site not specified: Secondary | ICD-10-CM

## 2021-05-02 DIAGNOSIS — E038 Other specified hypothyroidism: Secondary | ICD-10-CM

## 2021-05-02 LAB — POC URINALYSIS WITH MICROSCOPIC (NON AUTO)MANUAL RESULT
Bilirubin, UA: NEGATIVE
Blood, UA: NEGATIVE
Crystals: 0
Glucose, UA: NEGATIVE
Ketones, UA: NEGATIVE
Mucus, UA: 0
Nitrite, UA: NEGATIVE
Protein, UA: NEGATIVE
RBC: 0 M/uL — AB (ref 4.04–5.48)
Spec Grav, UA: <=1.005 — AB (ref 1.010–1.025)
Urobilinogen, UA: 0.2 E.U./dL
WBC Casts, UA: 4
pH, UA: 7 (ref 5.0–8.0)

## 2021-05-02 MED ORDER — DIAZEPAM 5 MG PO TABS: 5.0000 mg | ORAL_TABLET | Freq: Three times a day (TID) | ORAL | 0 refills | Status: DC | PRN

## 2021-05-02 NOTE — Progress Notes (Signed)
? ? ?Date:  05/02/2021  ? ?Name:  Moncerrath Berhe   DOB:  Aug 23, 1955   MRN:  [id number] ? ? ?Chief Complaint: Annual Exam ?Tahlor Berenguer is a 66 y.o. female who presents today for her Complete Annual Exam. She feels well. She reports exercising walking and yoga. She reports she is sleeping well. Breast complaints none. She sees GYN for breast and pelvic. ? ?Mammogram: 08/2020 ?DEXA: 12/2018 osteopenia ?Pap smear: 07/2018 neg ?Colonoscopy: 10/2020 ? ?There are no preventive care reminders to display for this patient. ?  ?Immunization History  ?Administered Date(s) Administered  ? Fluad Quad(high Dose 65+) 01/01/2020, 01/10/2021  ? Hepatitis A 10/27/2014  ? Hepatitis A, Adult 10/27/2014, 05/05/2015  ? Influenza,inj,Quad PF,6+ Mos 11/25/2018  ? Influenza-Unspecified 10/27/2014, 10/24/2018, 11/25/2018  ? PFIZER Comirnaty(Gray Top)Covid-19 Tri-Sucrose Vaccine 04/29/2019, 05/20/2019  ? PFIZER(Purple Top)SARS-COV-2 Vaccination 04/29/2019, 05/20/2019  ? PNEUMOCOCCAL CONJUGATE-20 05/10/2020  ? Tdap 04/08/2009  ? Zoster Recombinat (Shingrix) 12/17/2016, 12/09/2018  ? Zoster, Live 05/05/2015  ? ? ?Gastroesophageal Reflux ?She complains of heartburn. She reports no abdominal pain, no chest pain, no coughing or no wheezing. This is a recurrent problem. The problem occurs occasionally. Pertinent negatives include no fatigue. She has tried an herbal remedy for the symptoms. The treatment provided significant relief.  ?Urinary Tract Infection  ?This is a recurrent problem. The problem occurs intermittently (triggered by intercourse). Associated symptoms include frequency (nocturia; uti's after intercourse). Pertinent negatives include no chills, hematuria, hesitancy or vomiting. Treatments tried: macrobid PRN.  ?Thyroid Problem ?Presents for follow-up (followed by Endocrinology) visit. Symptoms include anxiety (about travel). Patient reports no constipation, diarrhea, fatigue, palpitations or tremors. The symptoms have been stable.   ? ?Lab Results  ?Component Value Date  ? NA 145 (H) 04/29/2020  ? K 4.4 04/29/2020  ? CO2 22 04/29/2020  ? GLUCOSE 86 04/29/2020  ? BUN 7 (L) 04/29/2020  ? CREATININE 0.61 04/29/2020  ? CALCIUM 9.3 04/29/2020  ? EGFR 99 04/29/2020  ? GFRNONAA 95 04/29/2019  ? ?Lab Results  ?Component Value Date  ? CHOL 204 (H) 04/29/2020  ? HDL 41 04/29/2020  ? LDLCALC 130 (H) 04/29/2020  ? TRIG 184 (H) 04/29/2020  ? CHOLHDL 5.0 (H) 04/29/2020  ? ?Lab Results  ?Component Value Date  ? TSH 4.740 (H) 04/29/2019  ? ?No results found for: HGBA1C ?Lab Results  ?Component Value Date  ? WBC 7.5 04/29/2020  ? HGB 14.1 04/29/2020  ? HCT 41.2 04/29/2020  ? MCV 98 (H) 04/29/2020  ? PLT 310 04/29/2020  ? ?Lab Results  ?Component Value Date  ? ALT 16 04/29/2020  ? AST 19 04/29/2020  ? ALKPHOS 100 04/29/2020  ? BILITOT 0.3 04/29/2020  ? ?No results found for: 25OHVITD2, [place], VD25OH  ? ?Review of Systems  ?Constitutional:  Negative for chills, fatigue and fever.  ?HENT:  Positive for hearing loss (has hearing aid). Negative for congestion, tinnitus, trouble swallowing and voice change.   ?Eyes:  Negative for visual disturbance.  ?Respiratory:  Negative for cough, chest tightness, shortness of breath and wheezing.   ?Cardiovascular:  Negative for chest pain, palpitations and leg swelling.  ?Gastrointestinal:  Positive for heartburn. Negative for abdominal pain, constipation, diarrhea and vomiting.  ?Endocrine: Negative for polydipsia and polyuria.  ?Genitourinary:  Positive for frequency (nocturia; uti's after intercourse). Negative for dysuria, genital sores, hematuria, hesitancy, vaginal bleeding and vaginal discharge.  ?Musculoskeletal:  Negative for arthralgias, gait problem and joint swelling.  ?Skin:  Negative for color change and rash.  ?Neurological:  Negative for dizziness, tremors, light-headedness and headaches.  ?Hematological:  Negative for adenopathy. Does not bruise/bleed easily.  ?Psychiatric/Behavioral:  Negative for  dysphoric mood and sleep disturbance. The patient is nervous/anxious (about travel).   ? ?Patient Active Problem List  ? Diagnosis Date Noted  ? Special screening for malignant neoplasms, colon   ? Subclinical hypothyroidism 04/29/2020  ? Joint pain in both hands 04/29/2020  ? History of cold sores 06/11/2019  ? Hx of abnormal cervical Pap smear 05/08/2019  ? Carpal tunnel syndrome of left wrist 04/29/2019  ? Mixed hyperlipidemia 12/25/2018  ? Gastroesophageal reflux disease with esophagitis without hemorrhage 12/25/2018  ? Primary osteoarthritis of both hands 12/25/2018  ? Tinnitus of both ears 12/06/2017  ? ? ?Allergies  ?Allergen Reactions  ? Ciprofloxacin   ?  Rash ?  ? Latex Rash  ? ? ?Past Surgical History:  ?Procedure Laterality Date  ? BREAST BIOPSY Left 2005  ? neg  ? CESAREAN SECTION    ? x3  ? COLONOSCOPY N/A 11/19/2020  ? Procedure: COLONOSCOPY;  Surgeon: Lucilla Lame, MD;  Location: [facility];  Service: Endoscopy;  Laterality: N/A;  ? ? ?Social History  ? ?Tobacco Use  ? Smoking status: Former  ?  Packs/day: 0.50  ?  Years: 30.00  ?  Pack years: 15.00  ?  Types: Cigarettes  ?  Quit date: 14  ?  Years since quitting: 33.2  ? Smokeless tobacco: Never  ?Vaping Use  ? Vaping Use: Never used  ?Substance Use Topics  ? Alcohol use: Yes  ?  Alcohol/week: 2.0 - 4.0 standard drinks  ?  Types: 2 - 4 Standard drinks or equivalent per week  ? Drug use: Yes  ?  Types: Marijuana  ?  Comment: also does edible.   ? ? ? ?Medication list has been reviewed and updated. ? ?Current Meds  ?Medication Sig  ? Ascorbic Acid (VITAMIN C) 1000 MG tablet Take 1,000 mg by mouth daily.  ? b complex vitamins tablet Take 1 tablet by mouth daily.  ? Boron 3 MG CAPS Take by mouth. Tablet  ? Cholecalciferol 50 MCG (2000 UT) TABS Take by mouth.  ? CRANBERRY EXTRACT PO Take by mouth.  ? estradiol (ESTRACE) 0.1 MG/GM vaginal cream as needed.  ? Iodine, Kelp, (KELP PO) Take by mouth daily.  ? nitrofurantoin,  macrocrystal-monohydrate, (MACROBID) 100 MG capsule TAKE 1 CAPSULE BY MOUTH AFTER SEX AS NEEDED  ? Probiotic Product (PROBIOTIC-10 PO) Take by mouth.  ? Turmeric 500 MG CAPS Take by mouth.  ? valACYclovir (VALTREX) 1000 MG tablet Take 1 tablet (1,000 mg total) by mouth 2 (two) times daily as needed.  ? ? ? ?  05/02/2021  ?  8:02 AM 01/10/2021  ? 10:54 AM 08/05/2020  ? 11:43 AM 04/29/2020  ?  8:08 AM  ?GAD 7 : Generalized Anxiety Score  ?Nervous, Anxious, on Edge 0 0 1 1  ?Control/stop worrying 0 0 0 1  ?Worry too much - different things 0 0 1 1  ?Trouble relaxing 0 0 0 0  ?Restless 0 0 0 0  ?Easily annoyed or irritable 0 0 0 0  ?Afraid - awful might happen 0 0 0 0  ?Total GAD 7 Score 0 0 2 3  ?Anxiety Difficulty Not difficult at all Not difficult at all Not difficult at all   ? ? ? ?  05/02/2021  ?  8:01 AM  ?Depression screen PHQ 2/9  ?Decreased Interest 0  ?Down, Depressed,  Hopeless 0  ?PHQ - 2 Score 0  ?Altered sleeping 0  ?Tired, decreased energy 0  ?Change in appetite 0  ?Feeling bad or failure about yourself  0  ?Trouble concentrating 0  ?Moving slowly or fidgety/restless 0  ?Suicidal thoughts 0  ?PHQ-9 Score 0  ?Difficult doing work/chores Not difficult at all  ? ? ?BP Readings from Last 3 Encounters:  ?05/02/21 112/70  ?01/10/21 130/76  ?11/19/20 104/71  ? ? ?Physical Exam ?Vitals and nursing note reviewed.  ?Constitutional:   ?   General: She is not in acute distress. ?   Appearance: She is well-developed.  ?HENT:  ?   Head: Normocephalic and atraumatic.  ?   Right Ear: Tympanic membrane and ear canal normal.  ?   Left Ear: Tympanic membrane and ear canal normal.  ?   Nose:  ?   Right Sinus: No maxillary sinus tenderness.  ?   Left Sinus: No maxillary sinus tenderness.  ?Eyes:  ?   General: No scleral icterus.    ?   Right eye: No discharge.     ?   Left eye: No discharge.  ?   Conjunctiva/sclera: Conjunctivae normal.  ?Neck:  ?   Thyroid: No thyromegaly.  ?   Vascular: No carotid bruit.  ?Cardiovascular:  ?    Rate and Rhythm: Normal rate and regular rhythm.  ?   Pulses: Normal pulses.  ?   Heart sounds: Normal heart sounds.  ?Pulmonary:  ?   Effort: Pulmonary effort is normal. No respiratory distress.  ?   Breath sounds: No wheezing.  ?Abdom

## 2021-05-03 LAB — CBC WITH DIFFERENTIAL/PLATELET
Basophils Absolute: 0.1 10*3/uL (ref 0.0–0.2)
Basos: 1 %
EOS (ABSOLUTE): 0.1 10*3/uL (ref 0.0–0.4)
Eos: 2 %
Hematocrit: 41.5 % (ref 34.0–46.6)
Hemoglobin: 14 g/dL (ref 11.1–15.9)
Immature Grans (Abs): 0 10*3/uL (ref 0.0–0.1)
Immature Granulocytes: 0 %
Lymphocytes Absolute: 2.5 10*3/uL (ref 0.7–3.1)
Lymphs: 37 %
MCH: 33.1 pg — ABNORMAL HIGH (ref 26.6–33.0)
MCHC: 33.7 g/dL (ref 31.5–35.7)
MCV: 98 fL — ABNORMAL HIGH (ref 79–97)
Monocytes Absolute: 0.6 10*3/uL (ref 0.1–0.9)
Monocytes: 8 %
Neutrophils Absolute: 3.5 10*3/uL (ref 1.4–7.0)
Neutrophils: 52 %
Platelets: 270 10*3/uL (ref 150–450)
RBC: 4.23 x10E6/uL (ref 3.77–5.28)
RDW: 12.1 % (ref 11.7–15.4)
WBC: 6.7 10*3/uL (ref 3.4–10.8)

## 2021-05-03 LAB — COMPREHENSIVE METABOLIC PANEL
ALT: 15 IU/L (ref 0–32)
AST: 17 IU/L (ref 0–40)
Albumin/Globulin Ratio: 2 (ref 1.2–2.2)
Albumin: 4.5 g/dL (ref 3.8–4.8)
Alkaline Phosphatase: 110 IU/L (ref 44–121)
BUN/Creatinine Ratio: 13 (ref 12–28)
BUN: 8 mg/dL (ref 8–27)
Bilirubin Total: 0.3 mg/dL (ref 0.0–1.2)
CO2: 25 mmol/L (ref 20–29)
Calcium: 9.4 mg/dL (ref 8.7–10.3)
Chloride: 106 mmol/L (ref 96–106)
Creatinine, Ser: 0.64 mg/dL (ref 0.57–1.00)
Globulin, Total: 2.2 g/dL (ref 1.5–4.5)
Glucose: 97 mg/dL (ref 70–99)
Potassium: 4.5 mmol/L (ref 3.5–5.2)
Sodium: 145 mmol/L — ABNORMAL HIGH (ref 134–144)
Total Protein: 6.7 g/dL (ref 6.0–8.5)
eGFR: 97 mL/min/{1.73_m2} (ref >59)

## 2021-05-03 LAB — LIPID PANEL
Chol/HDL Ratio: 4.6 ratio — ABNORMAL HIGH (ref 0.0–4.4)
Cholesterol, Total: 216 mg/dL — ABNORMAL HIGH (ref 100–199)
HDL: 47 mg/dL (ref >39)
LDL Chol Calc (NIH): 131 mg/dL — ABNORMAL HIGH (ref 0–99)
Triglycerides: 211 mg/dL — ABNORMAL HIGH (ref 0–149)
VLDL Cholesterol Cal: 38 mg/dL (ref 5–40)

## 2021-05-03 LAB — VITAMIN D 25 HYDROXY (VIT D DEFICIENCY, FRACTURES): Vit D, 25-Hydroxy: 20.4 ng/mL — ABNORMAL LOW (ref 30.0–100.0)

## 2021-05-05 ENCOUNTER — Encounter: Payer: Self-pay | Admitting: Internal Medicine

## 2021-06-08 ENCOUNTER — Encounter: Payer: Self-pay | Admitting: Internal Medicine

## 2021-08-17 ENCOUNTER — Ambulatory Visit: Payer: Medicare Other | Admitting: Nurse Practitioner

## 2021-08-17 ENCOUNTER — Encounter: Payer: Self-pay | Admitting: Nurse Practitioner

## 2021-08-17 DIAGNOSIS — Z0000 Encounter for general adult medical examination without abnormal findings: Secondary | ICD-10-CM

## 2021-08-17 NOTE — Progress Notes (Signed)
I connected with  Cassandra Hamilton on 08/17/21 by a audio enabled telemedicine application and verified that I am speaking with the correct person using two identifiers.  Patient Location: Home  Provider Location: Home Office  I discussed the limitations of evaluation and management by telemedicine. The patient expressed understanding and agreed to proceed.     Annual Wellness Visit     Patient: Cassandra Hamilton, Female    DOB: November 03, 1955, 66 y.o.   MRN: [id number]  Subjective  Chief Complaint  Patient presents with   annual well visit     Cassandra Hamilton is a 66 y.o. female who presents today for her Annual Wellness Visit. She reports consuming a vegan/plant based diet. Home exercise routine includes yoga. She generally feels well. She reports sleeping well. She does not have additional problems to discuss today.   HPI  Vision:Within last year and Dental: No current dental problems and Receives regular dental care   Patient Active Problem List   Diagnosis Date Noted   Special screening for malignant neoplasms, colon    Subclinical hypothyroidism 04/29/2020   Joint pain in both hands 04/29/2020   History of cold sores 06/11/2019   Hx of abnormal cervical Pap smear 05/08/2019   Carpal tunnel syndrome of left wrist 04/29/2019   Mixed hyperlipidemia 12/25/2018   Gastroesophageal reflux disease with esophagitis without hemorrhage 12/25/2018   Primary osteoarthritis of both hands 12/25/2018   Tinnitus of both ears 12/06/2017   Past Medical History:  Diagnosis Date   ADHD (attention deficit hyperactivity disorder)    GERD (gastroesophageal reflux disease)    Hyperthyroidism    Thyroid disease    Past Surgical History:  Procedure Laterality Date   BREAST BIOPSY Left 2005   neg   CESAREAN SECTION     x3   COLONOSCOPY N/A 11/19/2020   Procedure: COLONOSCOPY;  Surgeon: Midge Minium, MD;  Location: [facility];  Service: Endoscopy;  Laterality: N/A;   Social History    Tobacco Use   Smoking status: Former    Packs/day: 0.50    Years: 30.00    Total pack years: 15.00    Types: Cigarettes    Quit date: 1990    Years since quitting: 33.5   Smokeless tobacco: Never  Vaping Use   Vaping Use: Never used  Substance Use Topics   Alcohol use: Yes    Alcohol/week: 1.0 standard drink of alcohol    Types: 1 Standard drinks or equivalent per week   Drug use: Never   Family Status  Relation Name Status   Mother  Deceased at age 59       unknown cancer    Father  Deceased at age 56   MGM  Deceased   Neg Hx  (Not Specified)   Family History  Problem Relation Age of Onset   Cancer Mother    Testicular cancer Father    Diabetes Maternal Grandmother    Breast cancer Neg Hx    Allergies  Allergen Reactions   Ciprofloxacin     Rash    Latex Rash      Medications: Outpatient Medications Prior to Visit  Medication Sig   Ascorbic Acid (VITAMIN C) 1000 MG tablet Take 1,000 mg by mouth daily.   b complex vitamins tablet Take 1 tablet by mouth daily.   Boron 3 MG CAPS Take by mouth. Tablet   Cholecalciferol 50 MCG (2000 UT) TABS Take by mouth.   CRANBERRY EXTRACT PO Take by mouth.  diazepam (VALIUM) 5 MG tablet Take 1 tablet (5 mg total) by mouth every 8 (eight) hours as needed for anxiety.   estradiol (ESTRACE) 0.1 MG/GM vaginal cream as needed.   Iodine, Kelp, (KELP PO) Take by mouth daily.   nitrofurantoin, macrocrystal-monohydrate, (MACROBID) 100 MG capsule TAKE 1 CAPSULE BY MOUTH AFTER SEX AS NEEDED   Probiotic Product (PROBIOTIC-10 PO) Take by mouth.   Turmeric 500 MG CAPS Take by mouth.   valACYclovir (VALTREX) 1000 MG tablet Take 1 tablet (1,000 mg total) by mouth 2 (two) times daily as needed.   No facility-administered medications prior to visit.    Allergies  Allergen Reactions   Ciprofloxacin     Rash    Latex Rash    Patient Care Team: Reubin Milan, MD as PCP - General (Internal Medicine) Sherlon Handing, MD as  Consulting Physician (Internal Medicine) Ward, Elenora Fender, MD as Referring Physician (Obstetrics and Gynecology)      Objective  There were no vitals taken for this visit.  Physical Exam  No physical exam performed this was a telehealth visit   Most recent functional status assessment:    08/17/2021   10:25 AM  In your present state of health, do you have any difficulty performing the following activities:  Hearing? 1  Comment hearing aid  Vision? 0  Comment wears eye glasses  Difficulty concentrating or making decisions? 0  Walking or climbing stairs? 0  Dressing or bathing? 0  Doing errands, shopping? 0  Preparing Food and eating ? N  Using the Toilet? N  In the past six months, have you accidently leaked urine? N  Do you have problems with loss of bowel control? N  Managing your Medications? N  Managing your Finances? N  Housekeeping or managing your Housekeeping? N   Most recent fall risk assessment:    08/17/2021   10:24 AM  Fall Risk   Falls in the past year? 1  Number falls in past yr: 1  Injury with Fall? 1  Risk for fall due to : History of fall(s)  Follow up Falls evaluation completed;Falls prevention discussed    Most recent depression screenings:    08/17/2021   10:25 AM 08/17/2021   10:18 AM  PHQ 2/9 Scores  PHQ - 2 Score 0 0   Most recent cognitive screening:    08/17/2021   10:27 AM  6CIT Screen  What Year? 0 points  What month? 0 points  What time? 0 points  Count back from 20 0 points  Months in reverse 0 points  Repeat phrase 0 points  Total Score 0 points   Most recent Audit-C alcohol use screening    08/17/2021   10:21 AM  Alcohol Use Disorder Test (AUDIT)  1. How often do you have a drink containing alcohol? 1  2. How many drinks containing alcohol do you have on a typical day when you are drinking? 0  3. How often do you have six or more drinks on one occasion? 0  AUDIT-C Score 1   A score of 3 or more in women, and 4 or more  in men indicates increased risk for alcohol abuse, EXCEPT if all of the points are from question 1   Vision/Hearing Screen: Annual vision and dental up to date   Assessment & Plan   Annual wellness visit done today including the all of the following: Reviewed patient's Family Medical History Reviewed and updated list of patient's medical providers Assessment  of cognitive impairment was done Assessed patient's functional ability Established a written schedule for health screening services Health Risk Assessent Completed and Reviewed  Exercise Activities and Dietary recommendations  Goals      Increase physical activity     Continue yoga and goals to increase walking when weather improves         Immunization History  Administered Date(s) Administered   Fluad Quad(high Dose 65+) 01/01/2020, 01/10/2021   Hepatitis A 10/27/2014   Hepatitis A, Adult 10/27/2014, 05/05/2015   Influenza,inj,Quad PF,6+ Mos 11/25/2018   Influenza-Unspecified 10/27/2014, 10/24/2018, 11/25/2018   PFIZER Comirnaty(Gray Top)Covid-19 Tri-Sucrose Vaccine 04/29/2019, 05/20/2019   PFIZER(Purple Top)SARS-COV-2 Vaccination 04/29/2019, 05/20/2019   PNEUMOCOCCAL CONJUGATE-20 05/10/2020   Tdap 04/08/2009   Zoster Recombinat (Shingrix) 12/17/2016, 12/09/2018   Zoster, Live 05/05/2015    Health Maintenance  Topic Date Due   COVID-19 Vaccine (5 - Pfizer series) 09/02/2021 (Originally 07/15/2019)   INFLUENZA VACCINE  08/23/2021   MAMMOGRAM  08/26/2021   COLONOSCOPY (Pts 45-80yrs Insurance coverage will need to be confirmed)  11/20/2030   Pneumonia Vaccine 51+ Years old  Completed   DEXA SCAN  Completed   Hepatitis C Screening  Completed   Zoster Vaccines- Shingrix  Completed   HPV VACCINES  Aged Out   TETANUS/TDAP  Discontinued     Discussed health benefits of physical activity, and encouraged her to engage in regular exercise appropriate for her age and condition.    Problem List Items Addressed This  Visit   None   Advised she may receive another COVID vaccine at her local pharmacy at anytime.   Viviano Simas, FNP

## 2021-08-17 NOTE — Patient Instructions (Addendum)
Cassandra Hamilton , Thank you for taking time to come for your Medicare Wellness Visit. I appreciate your ongoing commitment to your health goals. Please review the following plan we discussed and let me know if I can assist you in the future.   Screening recommendations/referrals: Colonoscopy: 2032 Mammogram: scheduled for 08/26/2021 Bone Density: completed 01/09/2019 Recommended yearly ophthalmology/optometry visit for glaucoma screening and checkup Recommended yearly dental visit for hygiene and checkup  Vaccinations: Influenza vaccine: 08/2021 Pneumococcal vaccine: up to date  Tdap vaccine: overdue marked as complete- last was 2011 Shingles vaccine: up to date     Advanced directives: has a copy and believes she has submitted to PCP in the past   Conditions/risks identified: fall this year, tripped on curb- no ongoing mobility concerns. High fall risk due to fall in past year   Next appointment: 08/30/21   Preventive Care 65 Years and Older, Female Preventive care refers to lifestyle choices and visits with your health care provider that can promote health and wellness. What does preventive care include? A yearly physical exam. This is also called an annual well check. Dental exams once or twice a year. Routine eye exams. Ask your health care provider how often you should have your eyes checked. Personal lifestyle choices, including: Daily care of your teeth and gums. Regular physical activity. Eating a healthy diet. Avoiding tobacco and drug use. Limiting alcohol use. Practicing safe sex. Taking low-dose aspirin every day. Taking vitamin and mineral supplements as recommended by your health care provider. What happens during an annual well check? The services and screenings done by your health care provider during your annual well check will depend on your age, overall health, lifestyle risk factors, and family history of disease. Counseling  Your health care provider may ask you  questions about your: Alcohol use. Tobacco use. Drug use. Emotional well-being. Home and relationship well-being. Sexual activity. Eating habits. History of falls. Memory and ability to understand (cognition). Work and work Astronomer. Reproductive health. Screening  You may have the following tests or measurements: Height, weight, and BMI. Blood pressure. Lipid and cholesterol levels. These may be checked every 5 years, or more frequently if you are over 56 years old. Skin check. Lung cancer screening. You may have this screening every year starting at age 82 if you have a 30-pack-year history of smoking and currently smoke or have quit within the past 15 years. Fecal occult blood test (FOBT) of the stool. You may have this test every year starting at age 6. Flexible sigmoidoscopy or colonoscopy. You may have a sigmoidoscopy every 5 years or a colonoscopy every 10 years starting at age 61. Hepatitis C blood test. Hepatitis B blood test. Sexually transmitted disease (STD) testing. Diabetes screening. This is done by checking your blood sugar (glucose) after you have not eaten for a while (fasting). You may have this done every 1-3 years. Bone density scan. This is done to screen for osteoporosis. You may have this done starting at age 41. Mammogram. This may be done every 1-2 years. Talk to your health care provider about how often you should have regular mammograms. Talk with your health care provider about your test results, treatment options, and if necessary, the need for more tests. Vaccines  Your health care provider may recommend certain vaccines, such as: Influenza vaccine. This is recommended every year. Tetanus, diphtheria, and acellular pertussis (Tdap, Td) vaccine. You may need a Td booster every 10 years. Zoster vaccine. You may need this after age  60. Pneumococcal 13-valent conjugate (PCV13) vaccine. One dose is recommended after age 45. Pneumococcal polysaccharide  (PPSV23) vaccine. One dose is recommended after age 62. Talk to your health care provider about which screenings and vaccines you need and how often you need them. This information is not intended to replace advice given to you by your health care provider. Make sure you discuss any questions you have with your health care provider. Document Released: 02/05/2015 Document Revised: 09/29/2015 Document Reviewed: 11/10/2014 Elsevier Interactive Patient Education  2017 [place] Prevention in the Home Falls can cause injuries. They can happen to people of all ages. There are many things you can do to make your home safe and to help prevent falls. What can I do on the outside of my home? Regularly fix the edges of walkways and driveways and fix any cracks. Remove anything that might make you trip as you walk through a door, such as a raised step or threshold. Trim any bushes or trees on the path to your home. Use bright outdoor lighting. Clear any walking paths of anything that might make someone trip, such as rocks or tools. Regularly check to see if handrails are loose or broken. Make sure that both sides of any steps have handrails. Any raised decks and porches should have guardrails on the edges. Have any leaves, snow, or ice cleared regularly. Use sand or salt on walking paths during winter. Clean up any spills in your garage right away. This includes oil or grease spills. What can I do in the bathroom? Use night lights. Install grab bars by the toilet and in the tub and shower. Do not use towel bars as grab bars. Use non-skid mats or decals in the tub or shower. If you need to sit down in the shower, use a plastic, non-slip stool. Keep the floor dry. Clean up any water that spills on the floor as soon as it happens. Remove soap buildup in the tub or shower regularly. Attach bath mats securely with double-sided non-slip rug tape. Do not have throw rugs and other things on the  floor that can make you trip. What can I do in the bedroom? Use night lights. Make sure that you have a light by your bed that is easy to reach. Do not use any sheets or blankets that are too big for your bed. They should not hang down onto the floor. Have a firm chair that has side arms. You can use this for support while you get dressed. Do not have throw rugs and other things on the floor that can make you trip. What can I do in the kitchen? Clean up any spills right away. Avoid walking on wet floors. Keep items that you use a lot in easy-to-reach places. If you need to reach something above you, use a strong step stool that has a grab bar. Keep electrical cords out of the way. Do not use floor polish or wax that makes floors slippery. If you must use wax, use non-skid floor wax. Do not have throw rugs and other things on the floor that can make you trip. What can I do with my stairs? Do not leave any items on the stairs. Make sure that there are handrails on both sides of the stairs and use them. Fix handrails that are broken or loose. Make sure that handrails are as long as the stairways. Check any carpeting to make sure that it is firmly attached to the stairs. Fix  any carpet that is loose or worn. Avoid having throw rugs at the top or bottom of the stairs. If you do have throw rugs, attach them to the floor with carpet tape. Make sure that you have a light switch at the top of the stairs and the bottom of the stairs. If you do not have them, ask someone to add them for you. What else can I do to help prevent falls? Wear shoes that: Do not have high heels. Have rubber bottoms. Are comfortable and fit you well. Are closed at the toe. Do not wear sandals. If you use a stepladder: Make sure that it is fully opened. Do not climb a closed stepladder. Make sure that both sides of the stepladder are locked into place. Ask someone to hold it for you, if possible. Clearly mark and make  sure that you can see: Any grab bars or handrails. First and last steps. Where the edge of each step is. Use tools that help you move around (mobility aids) if they are needed. These include: Canes. Walkers. Scooters. Crutches. Turn on the lights when you go into a dark area. Replace any light bulbs as soon as they burn out. Set up your furniture so you have a clear path. Avoid moving your furniture around. If any of your floors are uneven, fix them. If there are any pets around you, be aware of where they are. Review your medicines with your doctor. Some medicines can make you feel dizzy. This can increase your chance of falling. Ask your doctor what other things that you can do to help prevent falls. This information is not intended to replace advice given to you by your health care provider. Make sure you discuss any questions you have with your health care provider. Document Released: 11/05/2008 Document Revised: 06/17/2015 Document Reviewed: 02/13/2014 Elsevier Interactive Patient Education  2017 [employer].

## 2021-08-30 ENCOUNTER — Ambulatory Visit
Admission: RE | Admit: 2021-08-30 | Discharge: 2021-08-30 | Disposition: A | Discharge status: Home / Self Care | Payer: Medicare Other | Source: Ambulatory Visit | Attending: Internal Medicine | Admitting: Internal Medicine

## 2021-08-30 DIAGNOSIS — Z1231 Encounter for screening mammogram for malignant neoplasm of breast: Secondary | ICD-10-CM | POA: Diagnosis not present

## 2022-04-10 ENCOUNTER — Encounter: Payer: Self-pay | Admitting: Internal Medicine

## 2022-04-12 LAB — TSH: TSH: 2.56 (ref 0.41–5.90)

## 2022-06-30 ENCOUNTER — Encounter: Payer: Medicare Other | Admitting: Internal Medicine

## 2022-06-30 ENCOUNTER — Encounter: Payer: Self-pay | Admitting: Internal Medicine

## 2022-06-30 ENCOUNTER — Ambulatory Visit (INDEPENDENT_AMBULATORY_CARE_PROVIDER_SITE_OTHER): Payer: Medicare Other | Admitting: Internal Medicine

## 2022-06-30 VITALS — BP 102/64 | HR 80 | Ht 66.0 in | Wt 146.0 lb

## 2022-06-30 DIAGNOSIS — K2100 Gastro-esophageal reflux disease with esophagitis, without bleeding: Secondary | ICD-10-CM | POA: Diagnosis not present

## 2022-06-30 DIAGNOSIS — S50311A Abrasion of right elbow, initial encounter: Secondary | ICD-10-CM | POA: Diagnosis not present

## 2022-06-30 DIAGNOSIS — E038 Other specified hypothyroidism: Secondary | ICD-10-CM

## 2022-06-30 DIAGNOSIS — E559 Vitamin D deficiency, unspecified: Secondary | ICD-10-CM

## 2022-06-30 DIAGNOSIS — Z23 Encounter for immunization: Secondary | ICD-10-CM | POA: Diagnosis not present

## 2022-06-30 DIAGNOSIS — Z1231 Encounter for screening mammogram for malignant neoplasm of breast: Secondary | ICD-10-CM | POA: Diagnosis not present

## 2022-06-30 DIAGNOSIS — M8580 Other specified disorders of bone density and structure, unspecified site: Secondary | ICD-10-CM

## 2022-06-30 DIAGNOSIS — E782 Mixed hyperlipidemia: Secondary | ICD-10-CM

## 2022-06-30 DIAGNOSIS — M67449 Ganglion, unspecified hand: Secondary | ICD-10-CM

## 2022-06-30 NOTE — Assessment & Plan Note (Deleted)
Lipids managed with diet alone. Lab Results  Component Value Date   LDLCALC 131 (H) 05/02/2021

## 2022-06-30 NOTE — Assessment & Plan Note (Signed)
On vitamin D daily 

## 2022-06-30 NOTE — Assessment & Plan Note (Deleted)
On vitamin D 2000 IU daily

## 2022-06-30 NOTE — Progress Notes (Signed)
Date:  06/30/2022   Name:  Cassandra Hamilton   DOB:  07/22/1955   MRN:  [id number]   Chief Complaint: Annual Exam  Cassandra Hamilton is a 67 y.o. female who presents today for her Complete Annual Exam. She feels well. She reports exercising. She reports she is sleeping fairly well. Breast complaints - none.  Mammogram: 08/2021 DEXA: 01/2018 osteopenia Colonoscopy: 10/2020  Health Maintenance Due  Topic Date Due   COVID-19 Vaccine (5 - 2023-24 season) 09/23/2021   Medicare Annual Wellness (AWV)  08/18/2022    Immunization History  Administered Date(s) Administered   Fluad Quad(high Dose 65+) 01/01/2020, 01/10/2021   Hepatitis A 10/27/2014   Hepatitis A, Adult 10/27/2014, 05/05/2015   Influenza,inj,Quad PF,6+ Mos 11/25/2018   Influenza-Unspecified 10/27/2014, 10/24/2018, 11/25/2018   PFIZER Comirnaty(Gray Top)Covid-19 Tri-Sucrose Vaccine 04/29/2019, 05/20/2019   PFIZER(Purple Top)SARS-COV-2 Vaccination 04/29/2019, 05/20/2019   PNEUMOCOCCAL CONJUGATE-20 05/10/2020   Tdap 04/08/2009, 06/30/2022   Zoster Recombinat (Shingrix) 12/17/2016, 12/09/2018   Zoster, Live 05/05/2015     Gastroesophageal Reflux She complains of heartburn. She reports no abdominal pain, no chest pain, no coughing or no wheezing. This is a recurrent problem. The problem occurs occasionally. The symptoms are aggravated by certain foods. Pertinent negatives include no fatigue. She has tried a diet change for the symptoms.  Thyroid Problem Presents for follow-up visit. Patient reports no anxiety, constipation, diarrhea, fatigue, palpitations or tremors. The symptoms have been stable (endo treating with low dose supplement.).  Cyst - on left middle finger - gets in the way of activities and she would like it removed. Abrasion - she fell yesterday at home in the driveway.  She was carrying a heavy pot and lost her balance.  She has a bruise of her right hip, and left breast and abrasion of right elbow and forearm.  Tetanus  last done in 2011.   Lab Results  Component Value Date   NA 145 (H) 05/02/2021   K 4.5 05/02/2021   CO2 25 05/02/2021   GLUCOSE 97 05/02/2021   BUN 8 05/02/2021   CREATININE 0.64 05/02/2021   CALCIUM 9.4 05/02/2021   EGFR 97 05/02/2021   GFRNONAA 95 04/29/2019   Lab Results  Component Value Date   CHOL 216 (H) 05/02/2021   HDL 47 05/02/2021   LDLCALC 131 (H) 05/02/2021   TRIG 211 (H) 05/02/2021   CHOLHDL 4.6 (H) 05/02/2021   Lab Results  Component Value Date   TSH 2.56 04/12/2022   No results found for: "HGBA1C" Lab Results  Component Value Date   WBC 6.7 05/02/2021   HGB 14.0 05/02/2021   HCT 41.5 05/02/2021   MCV 98 (H) 05/02/2021   PLT 270 05/02/2021   Lab Results  Component Value Date   ALT 15 05/02/2021   AST 17 05/02/2021   ALKPHOS 110 05/02/2021   BILITOT 0.3 05/02/2021   Lab Results  Component Value Date   VD25OH 20.4 (L) 05/02/2021     Review of Systems  Constitutional:  Negative for chills, fatigue and fever.  HENT:  Positive for hearing loss. Negative for congestion, tinnitus, trouble swallowing and voice change.   Eyes:  Negative for visual disturbance.  Respiratory:  Negative for cough, chest tightness, shortness of breath and wheezing.   Cardiovascular:  Negative for chest pain, palpitations and leg swelling.  Gastrointestinal:  Positive for heartburn. Negative for abdominal pain, constipation, diarrhea and vomiting.  Endocrine: Negative for polydipsia and polyuria.  Genitourinary:  Negative for dysuria, frequency, genital sores,  vaginal bleeding and vaginal discharge.  Musculoskeletal:  Negative for arthralgias, gait problem and joint swelling.  Skin:  Positive for wound (abrasion right elbow). Negative for color change and rash.  Neurological:  Negative for dizziness, tremors, light-headedness and headaches.  Hematological:  Negative for adenopathy. Does not bruise/bleed easily.  Psychiatric/Behavioral:  Negative for dysphoric mood and  sleep disturbance. The patient is not nervous/anxious.     Patient Active Problem List   Diagnosis Date Noted   Vitamin D deficiency 06/30/2022   Subclinical hypothyroidism 04/29/2020   History of cold sores 06/11/2019   Hx of abnormal cervical Pap smear 05/08/2019   Mixed hyperlipidemia 12/25/2018   Gastroesophageal reflux disease with esophagitis without hemorrhage 12/25/2018   Primary osteoarthritis of both hands 12/25/2018   Tinnitus of both ears 12/06/2017    Allergies  Allergen Reactions   Ciprofloxacin     Rash    Latex Rash    Past Surgical History:  Procedure Laterality Date   BREAST BIOPSY Left 2005   neg   CESAREAN SECTION     x3   COLONOSCOPY N/A 11/19/2020   Procedure: COLONOSCOPY;  Surgeon: Midge Minium, MD;  Location[facility];  Service: Endoscopy;  Laterality: N/A;    Social History   Tobacco Use   Smoking status: Former    Packs/day: 0.50    Years: 30.00    Additional pack years: 0.00    Total pack years: 15.00    Types: Cigarettes    Quit date: 1990    Years since quitting: 34.4   Smokeless tobacco: Never  Vaping Use   Vaping Use: Never used  Substance Use Topics   Alcohol use: Yes    Alcohol/week: 1.0 standard drink of alcohol    Types: 1 Standard drinks or equivalent per week   Drug use: Never     Medication list has been reviewed and updated.  Current Meds  Medication Sig   Ascorbic Acid (VITAMIN C) 1000 MG tablet Take 1,000 mg by mouth daily.   Cholecalciferol 50 MCG (2000 UT) TABS Take by mouth.   CRANBERRY EXTRACT PO Take by mouth.   cyanocobalamin (VITAMIN B12) 500 MCG tablet Take 500 mcg by mouth daily.   diazepam (VALIUM) 5 MG tablet Take 1 tablet (5 mg total) by mouth every 8 (eight) hours as needed for anxiety.   estradiol (ESTRACE) 0.1 MG/GM vaginal cream as needed.   levothyroxine (SYNTHROID) 50 MCG tablet Take 50 mcg by mouth daily before breakfast.   nitrofurantoin, macrocrystal-monohydrate, (MACROBID) 100  MG capsule TAKE 1 CAPSULE BY MOUTH AFTER SEX AS NEEDED   valACYclovir (VALTREX) 1000 MG tablet Take 1 tablet (1,000 mg total) by mouth 2 (two) times daily as needed.   [DISCONTINUED] Probiotic Product (PROBIOTIC-10 PO) Take by mouth.       06/30/2022   10:31 AM 05/02/2021    8:02 AM 01/10/2021   10:54 AM 08/05/2020   11:43 AM  GAD 7 : Generalized Anxiety Score  Nervous, Anxious, on Edge 1 0 0 1  Control/stop worrying 1 0 0 0  Worry too much - different things 1 0 0 1  Trouble relaxing 1 0 0 0  Restless 1 0 0 0  Easily annoyed or irritable 0 0 0 0  Afraid - awful might happen 1 0 0 0  Total GAD 7 Score 6 0 0 2  Anxiety Difficulty Somewhat difficult Not difficult at all Not difficult at all Not difficult at all  06/30/2022   10:31 AM 08/17/2021   10:25 AM 08/17/2021   10:18 AM  Depression screen PHQ 2/9  Decreased Interest 0 0 0  Down, Depressed, Hopeless 0 0 0  PHQ - 2 Score 0 0 0  Altered sleeping 2    Tired, decreased energy 1    Change in appetite 0    Feeling bad or failure about yourself  1    Trouble concentrating 0    Moving slowly or fidgety/restless 0    Suicidal thoughts 0    PHQ-9 Score 4    Difficult doing work/chores Not difficult at all      BP Readings from Last 3 Encounters:  06/30/22 102/64  05/02/21 112/70  01/10/21 130/76    Physical Exam Vitals and nursing note reviewed.  Constitutional:      General: She is not in acute distress.    Appearance: She is well-developed.  HENT:     Head: Normocephalic and atraumatic.     Right Ear: Tympanic membrane and ear canal normal.     Left Ear: Tympanic membrane and ear canal normal.     Nose:     Right Sinus: No maxillary sinus tenderness.     Left Sinus: No maxillary sinus tenderness.  Eyes:     General: No scleral icterus.       Right eye: No discharge.        Left eye: No discharge.     Conjunctiva/sclera: Conjunctivae normal.  Neck:     Thyroid: No thyromegaly.     Vascular: No carotid  bruit.  Cardiovascular:     Rate and Rhythm: Normal rate and regular rhythm.     Pulses: Normal pulses.     Heart sounds: Normal heart sounds.  Pulmonary:     Effort: Pulmonary effort is normal. No respiratory distress.     Breath sounds: No wheezing.  Chest:  Breasts:    Right: No mass, nipple discharge, skin change or tenderness.     Left: No mass, nipple discharge, skin change or tenderness.       Comments: bruise Abdominal:     General: Bowel sounds are normal.     Palpations: Abdomen is soft.     Tenderness: There is no abdominal tenderness.  Musculoskeletal:       Arms:     Cervical back: Normal range of motion. No erythema.     Right hip: Tenderness present. No bony tenderness or crepitus. Normal range of motion.     Right lower leg: No edema.     Left lower leg: No edema.     Comments: Abrasion without bleeding right arm  3/4 cm smooth firm cystic lesion lateral middle finger proximal phalanx  Lymphadenopathy:     Cervical: No cervical adenopathy.  Skin:    General: Skin is warm and dry.     Findings: No rash.  Neurological:     Mental Status: She is alert and oriented to person, place, and time.     Cranial Nerves: No cranial nerve deficit.     Sensory: No sensory deficit.     Deep Tendon Reflexes: Reflexes are normal and symmetric.  Psychiatric:        Attention and Perception: Attention normal.        Mood and Affect: Mood normal.     Wt Readings from Last 3 Encounters:  06/30/22 146 lb (66.2 kg)  05/02/21 147 lb (66.7 kg)  01/10/21 145 lb 12.8 oz (66.1 kg)  BP 102/64   Pulse 80   Ht 5\' 6"  (1.676 m)   Wt 146 lb (66.2 kg)   SpO2 96%   BMI 23.57 kg/m   Assessment and Plan:  Problem List Items Addressed This Visit     Vitamin D deficiency    On vitamin D daily      Relevant Orders   VITAMIN D 25 Hydroxy (Vit-D Deficiency, Fractures)   Subclinical hypothyroidism - Primary    Being monitored by Endo Recent TSH was elevated so low dose  supplement was started. Follow up TSH = 2.56        Relevant Medications   levothyroxine (SYNTHROID) 50 MCG tablet   Mixed hyperlipidemia    Lipids managed with diet alone.      Lab Results  Component Value Date    LDLCALC         Relevant Orders   Lipid panel   Gastroesophageal reflux disease with esophagitis without hemorrhage    Reflux symptoms are minimal on current therapy - diet changes. No red flag signs such as weight loss, n/v, or melena       Relevant Orders   CBC with Differential/Platelet   Other Visit Diagnoses     Encounter for screening mammogram for breast cancer       Relevant Orders   MM 3D SCREENING MAMMOGRAM BILATERAL BREAST   Osteopenia determined by x-ray       Relevant Orders   Comprehensive metabolic panel   Abrasion of right elbow, initial encounter       Relevant Orders   Tdap vaccine greater than or equal to 7yo IM (Completed)   Ganglion cyst of finger       Relevant Orders   Ambulatory referral to Orthopedic Surgery       Return in about 1 year (around 06/30/2023) for Medicare annual.   Partially dictated using Dragon software, any errors are not intentional.  Reubin Milan, MD [facility] [place], [place]

## 2022-06-30 NOTE — Assessment & Plan Note (Deleted)
Continue to monitor lab and symptoms with Endo      Lab Results  Component Value Date    TSH

## 2022-06-30 NOTE — Progress Notes (Deleted)
Date:  06/30/2022   Name:  Cassandra Hamilton   DOB:  07/06/1955   MRN:  [id number]   Chief Complaint: No chief complaint on file. Cassandra Hamilton is a 67 y.o. female who presents today for her Complete Annual Exam. She feels {DESC; WELL/FAIRLY WELL/POORLY:18703}. She reports exercising ***. She reports she is sleeping {DESC; WELL/FAIRLY WELL/POORLY:18703}. Breast complaints ***.  Mammogram: 08/2021 DEXA: 12/2018 osteopenia Colonoscopy: 10/2020 repeat 10 yrs  Health Maintenance Due  Topic Date Due   DTaP/Tdap/Td (2 - Td or Tdap) 04/09/2019   COVID-19 Vaccine (5 - 2023-24 season) 09/23/2021   Medicare Annual Wellness (AWV)  08/18/2022    Immunization History  Administered Date(s) Administered   Fluad Quad(high Dose 65+) 01/01/2020, 01/10/2021   Hepatitis A 10/27/2014   Hepatitis A, Adult 10/27/2014, 05/05/2015   Influenza,inj,Quad PF,6+ Mos 11/25/2018   Influenza-Unspecified 10/27/2014, 10/24/2018, 11/25/2018   PFIZER Comirnaty(Gray Top)Covid-19 Tri-Sucrose Vaccine 04/29/2019, 05/20/2019   PFIZER(Purple Top)SARS-COV-2 Vaccination 04/29/2019, 05/20/2019   PNEUMOCOCCAL CONJUGATE-20 05/10/2020   Tdap 04/08/2009   Zoster Recombinat (Shingrix) 12/17/2016, 12/09/2018   Zoster, Live 05/05/2015    Hyperlipidemia This is a chronic problem. Recent lipid tests were reviewed and are variable. Pertinent negatives include no chest pain or shortness of breath. Current antihyperlipidemic treatment includes diet change.  Gastroesophageal Reflux She complains of heartburn. She reports no abdominal pain, no chest pain, no coughing or no wheezing. This is a recurrent problem. The problem occurs rarely. Pertinent negatives include no fatigue.    Lab Results  Component Value Date   NA 145 (H) 05/02/2021   K 4.5 05/02/2021   CO2 25 05/02/2021   GLUCOSE 97 05/02/2021   BUN 8 05/02/2021   CREATININE 0.64 05/02/2021   CALCIUM 9.4 05/02/2021   EGFR 97 05/02/2021   GFRNONAA 95 04/29/2019   Lab  Results  Component Value Date   CHOL 216 (H) 05/02/2021   HDL 47 05/02/2021   LDLCALC 131 (H) 05/02/2021   TRIG 211 (H) 05/02/2021   CHOLHDL 4.6 (H) 05/02/2021   Lab Results  Component Value Date   TSH 4.740 (H) 04/29/2019   No results found for: "HGBA1C" Lab Results  Component Value Date   WBC 6.7 05/02/2021   HGB 14.0 05/02/2021   HCT 41.5 05/02/2021   MCV 98 (H) 05/02/2021   PLT 270 05/02/2021   Lab Results  Component Value Date   ALT 15 05/02/2021   AST 17 05/02/2021   ALKPHOS 110 05/02/2021   BILITOT 0.3 05/02/2021   Lab Results  Component Value Date   VD25OH 20.4 (L) 05/02/2021     Review of Systems  Constitutional:  Negative for chills, fatigue and fever.  HENT:  Negative for congestion, hearing loss, tinnitus, trouble swallowing and voice change.   Eyes:  Negative for visual disturbance.  Respiratory:  Negative for cough, chest tightness, shortness of breath and wheezing.   Cardiovascular:  Negative for chest pain, palpitations and leg swelling.  Gastrointestinal:  Positive for heartburn. Negative for abdominal pain, constipation, diarrhea and vomiting.  Endocrine: Negative for polydipsia and polyuria.  Genitourinary:  Negative for dysuria, frequency, genital sores, vaginal bleeding and vaginal discharge.  Musculoskeletal:  Negative for arthralgias, gait problem and joint swelling.  Skin:  Negative for color change and rash.  Neurological:  Negative for dizziness, tremors, light-headedness and headaches.  Hematological:  Negative for adenopathy. Does not bruise/bleed easily.  Psychiatric/Behavioral:  Negative for dysphoric mood and sleep disturbance. The patient is not nervous/anxious.  Patient Active Problem List   Diagnosis Date Noted   Vitamin D deficiency 06/30/2022   Special screening for malignant neoplasms, colon    Subclinical hypothyroidism 04/29/2020   Joint pain in both hands 04/29/2020   History of cold sores 06/11/2019   Hx of abnormal  cervical Pap smear 05/08/2019   Mixed hyperlipidemia 12/25/2018   Gastroesophageal reflux disease with esophagitis without hemorrhage 12/25/2018   Primary osteoarthritis of both hands 12/25/2018   Tinnitus of both ears 12/06/2017    Allergies  Allergen Reactions   Ciprofloxacin     Rash    Latex Rash    Past Surgical History:  Procedure Laterality Date   BREAST BIOPSY Left 2005   neg   CESAREAN SECTION     x3   COLONOSCOPY N/A 11/19/2020   Procedure: COLONOSCOPY;  Surgeon: Midge Minium, MD;  Location: [facility];  Service: Endoscopy;  Laterality: N/A;    Social History   Tobacco Use   Smoking status: Former    Packs/day: 0.50    Years: 30.00    Additional pack years: 0.00    Total pack years: 15.00    Types: Cigarettes    Quit date: 1990    Years since quitting: 34.4   Smokeless tobacco: Never  Vaping Use   Vaping Use: Never used  Substance Use Topics   Alcohol use: Yes    Alcohol/week: 1.0 standard drink of alcohol    Types: 1 Standard drinks or equivalent per week   Drug use: Never     Medication list has been reviewed and updated.  No outpatient medications have been marked as taking for the 06/30/22 encounter (Appointment) with Reubin Milan, MD.       05/02/2021    8:02 AM 01/10/2021   10:54 AM 08/05/2020   11:43 AM 04/29/2020    8:08 AM  GAD 7 : Generalized Anxiety Score  Nervous, Anxious, on Edge 0 0 1 1  Control/stop worrying 0 0 0 1  Worry too much - different things 0 0 1 1  Trouble relaxing 0 0 0 0  Restless 0 0 0 0  Easily annoyed or irritable 0 0 0 0  Afraid - awful might happen 0 0 0 0  Total GAD 7 Score 0 0 2 3  Anxiety Difficulty Not difficult at all Not difficult at all Not difficult at all        08/17/2021   10:25 AM 08/17/2021   10:18 AM 08/17/2021   10:17 AM  Depression screen PHQ 2/9  Decreased Interest 0 0 0  Down, Depressed, Hopeless 0 0 0  PHQ - 2 Score 0 0 0    BP Readings from Last 3 Encounters:  05/02/21  112/70  01/10/21 130/76  11/19/20 104/71    Physical Exam Vitals and nursing note reviewed.  Constitutional:      General: She is not in acute distress.    Appearance: She is well-developed.  HENT:     Head: Normocephalic and atraumatic.     Right Ear: Tympanic membrane and ear canal normal.     Left Ear: Tympanic membrane and ear canal normal.     Nose:     Right Sinus: No maxillary sinus tenderness.     Left Sinus: No maxillary sinus tenderness.  Eyes:     General: No scleral icterus.       Right eye: No discharge.        Left eye: No discharge.  Conjunctiva/sclera: Conjunctivae normal.  Neck:     Thyroid: No thyromegaly.     Vascular: No carotid bruit.  Cardiovascular:     Rate and Rhythm: Normal rate and regular rhythm.     Pulses: Normal pulses.     Heart sounds: Normal heart sounds.  Pulmonary:     Effort: Pulmonary effort is normal. No respiratory distress.     Breath sounds: No wheezing.  Chest:  Breasts:    Right: No mass, nipple discharge, skin change or tenderness.     Left: No mass, nipple discharge, skin change or tenderness.  Abdominal:     General: Bowel sounds are normal.     Palpations: Abdomen is soft.     Tenderness: There is no abdominal tenderness.  Musculoskeletal:     Cervical back: Normal range of motion. No erythema.     Right lower leg: No edema.     Left lower leg: No edema.  Lymphadenopathy:     Cervical: No cervical adenopathy.  Skin:    General: Skin is warm and dry.     Findings: No rash.  Neurological:     Mental Status: She is alert and oriented to person, place, and time.     Cranial Nerves: No cranial nerve deficit.     Sensory: No sensory deficit.     Deep Tendon Reflexes: Reflexes are normal and symmetric.  Psychiatric:        Attention and Perception: Attention normal.        Mood and Affect: Mood normal.     Wt Readings from Last 3 Encounters:  05/02/21 147 lb (66.7 kg)  01/10/21 145 lb 12.8 oz (66.1 kg)   11/19/20 139 lb (63 kg)    There were no vitals taken for this visit.  Assessment and Plan:  Problem List Items Addressed This Visit     Vitamin D deficiency    On vitamin D 2000 IU daily      Subclinical hypothyroidism    Continue to monitor lab and symptoms with Endo Lab Results  Component Value Date   TSH 4.740 (H) 04/29/2019        Mixed hyperlipidemia - Primary    Lipids managed with diet alone. Lab Results  Component Value Date   LDLCALC 131 (H) 05/02/2021        Gastroesophageal reflux disease with esophagitis without hemorrhage    Reflux symptoms are minimal on current therapy - diet changes. No red flag signs such as weight loss, n/v, or melena      Other Visit Diagnoses     Encounter for screening mammogram for breast cancer           No follow-ups on file.   Partially dictated using Dragon software, any errors are not intentional.  Reubin Milan, MD [facility] [place], [place]

## 2022-06-30 NOTE — Assessment & Plan Note (Signed)
Reflux symptoms are minimal on current therapy - diet changes. No red flag signs such as weight loss, n/v, or melena

## 2022-06-30 NOTE — Patient Instructions (Addendum)
Call [facility] to schedule your mammogram at [phone].  Someone from Orthopedics will call you to schedule an appointment.

## 2022-06-30 NOTE — Assessment & Plan Note (Signed)
Lipids managed with diet alone.      Lab Results  Component Value Date    [facility]

## 2022-06-30 NOTE — Assessment & Plan Note (Addendum)
Being monitored by Endo Recent TSH was elevated so low dose supplement was started. Follow up TSH = 2.56

## 2022-06-30 NOTE — Assessment & Plan Note (Deleted)
Continue to monitor lab and symptoms with Endo Lab Results  Component Value Date   TSH 4.740 (H) 04/29/2019

## 2022-06-30 NOTE — Assessment & Plan Note (Deleted)
Reflux symptoms are minimal on current therapy - diet changes. No red flag signs such as weight loss, n/v, or melena   

## 2022-07-01 ENCOUNTER — Encounter: Payer: Self-pay | Admitting: Internal Medicine

## 2022-07-01 LAB — CBC WITH DIFFERENTIAL/PLATELET
Basophils Absolute: 0.1 10*3/uL (ref 0.0–0.2)
Basos: 1 %
EOS (ABSOLUTE): 0.3 10*3/uL (ref 0.0–0.4)
Eos: 4 %
Hematocrit: 42.2 % (ref 34.0–46.6)
Hemoglobin: 14.6 g/dL (ref 11.1–15.9)
Immature Grans (Abs): 0 10*3/uL (ref 0.0–0.1)
Immature Granulocytes: 0 %
Lymphocytes Absolute: 2.7 10*3/uL (ref 0.7–3.1)
Lymphs: 37 %
MCH: 33.1 pg — ABNORMAL HIGH (ref 26.6–33.0)
MCHC: 34.6 g/dL (ref 31.5–35.7)
MCV: 96 fL (ref 79–97)
Monocytes Absolute: 0.5 10*3/uL (ref 0.1–0.9)
Monocytes: 7 %
Neutrophils Absolute: 3.7 10*3/uL (ref 1.4–7.0)
Neutrophils: 51 %
Platelets: 291 10*3/uL (ref 150–450)
RBC: 4.41 x10E6/uL (ref 3.77–5.28)
RDW: 11.8 % (ref 11.7–15.4)
WBC: 7.2 10*3/uL (ref 3.4–10.8)

## 2022-07-01 LAB — LIPID PANEL
Chol/HDL Ratio: 4.8 ratio — ABNORMAL HIGH (ref 0.0–4.4)
Cholesterol, Total: 235 mg/dL — ABNORMAL HIGH (ref 100–199)
HDL: 49 mg/dL (ref >39)
LDL Chol Calc (NIH): 154 mg/dL — ABNORMAL HIGH (ref 0–99)
Triglycerides: 174 mg/dL — ABNORMAL HIGH (ref 0–149)
VLDL Cholesterol Cal: 32 mg/dL (ref 5–40)

## 2022-07-01 LAB — COMPREHENSIVE METABOLIC PANEL
ALT: 14 IU/L (ref 0–32)
AST: 17 IU/L (ref 0–40)
Albumin/Globulin Ratio: 2.1 (ref 1.2–2.2)
Albumin: 4.6 g/dL (ref 3.9–4.9)
Alkaline Phosphatase: 105 IU/L (ref 44–121)
BUN/Creatinine Ratio: 17 (ref 12–28)
BUN: 12 mg/dL (ref 8–27)
Bilirubin Total: 0.5 mg/dL (ref 0.0–1.2)
CO2: 23 mmol/L (ref 20–29)
Calcium: 9.2 mg/dL (ref 8.7–10.3)
Chloride: 103 mmol/L (ref 96–106)
Creatinine, Ser: 0.7 mg/dL (ref 0.57–1.00)
Globulin, Total: 2.2 g/dL (ref 1.5–4.5)
Glucose: 93 mg/dL (ref 70–99)
Potassium: 4.2 mmol/L (ref 3.5–5.2)
Sodium: 139 mmol/L (ref 134–144)
Total Protein: 6.8 g/dL (ref 6.0–8.5)
eGFR: 95 mL/min/{1.73_m2} (ref >59)

## 2022-07-01 LAB — VITAMIN D 25 HYDROXY (VIT D DEFICIENCY, FRACTURES): Vit D, 25-Hydroxy: 33.4 ng/mL (ref 30.0–100.0)

## 2022-08-24 ENCOUNTER — Ambulatory Visit (INDEPENDENT_AMBULATORY_CARE_PROVIDER_SITE_OTHER): Payer: Medicare Other

## 2022-08-24 VITALS — Ht 66.0 in | Wt 146.0 lb

## 2022-08-24 DIAGNOSIS — Z0000 Encounter for general adult medical examination without abnormal findings: Secondary | ICD-10-CM

## 2022-08-24 NOTE — Progress Notes (Signed)
Subjective:   Cassandra Hamilton is a 67 y.o. female who presents for Medicare Annual (Subsequent) preventive examination.  Visit Complete: Virtual  I connected with  Cassandra Hamilton on 08/24/22 by a audio enabled telemedicine application and verified that I am speaking with the correct person using two identifiers.  Patient Location: Home  Provider Location: Office/Clinic  I discussed the limitations of evaluation and management by telemedicine. The patient expressed understanding and agreed to proceed.  Vital Signs: Patient was unable to self-report vital signs via telehealth due to a lack of equipment at home.   Review of Systems     Cardiac Risk Factors include: advanced age (>39men, >7 women);dyslipidemia     Objective:    Today's Vitals   08/24/22 0928 08/24/22 0941  Weight:  146 lb (66.2 kg)  Height:  5\' 6"  (1.676 m)  PainSc: 0-No pain    Body mass index is 23.57 kg/m.     08/24/2022    9:32 AM 08/17/2021   10:23 AM 11/19/2020    7:44 AM 08/16/2020   11:00 AM 11/25/2018    2:36 PM  Advanced Directives  Does Patient Have a Medical Advance Directive? Yes Yes Yes Yes No  Type of Estate agent of [place];Living will Healthcare Power of [employer] of [place];Living will Healthcare Power of [place];Living will;Out of facility DNR (pink MOST or yellow form)   Does patient want to make changes to medical advance directive? No - Patient declined No - Patient declined No - Patient declined    Copy of Healthcare Power of Attorney in Chart? Yes - validated most recent copy scanned in chart (See row information) Yes - validated most recent copy scanned in chart (See row information) Yes - validated most recent copy scanned in chart (See row information) No - copy requested     Current Medications (verified) Outpatient Encounter Medications as of 08/24/2022  Medication Sig   Cholecalciferol 50 MCG (2000 UT) TABS Take by mouth.   CRANBERRY  EXTRACT PO Take by mouth.   cyanocobalamin (VITAMIN B12) 500 MCG tablet Take 500 mcg by mouth daily.   diazepam (VALIUM) 5 MG tablet Take 1 tablet (5 mg total) by mouth every 8 (eight) hours as needed for anxiety.   estradiol (ESTRACE) 0.1 MG/GM vaginal cream as needed.   levothyroxine (SYNTHROID) 50 MCG tablet Take 50 mcg by mouth daily before breakfast.   valACYclovir (VALTREX) 1000 MG tablet Take 1 tablet (1,000 mg total) by mouth 2 (two) times daily as needed.   Ascorbic Acid (VITAMIN C) 1000 MG tablet Take 1,000 mg by mouth daily.   nitrofurantoin, macrocrystal-monohydrate, (MACROBID) 100 MG capsule TAKE 1 CAPSULE BY MOUTH AFTER SEX AS NEEDED (Patient not taking: Reported on 08/24/2022)   No facility-administered encounter medications on file as of 08/24/2022.    Allergies (verified) Ciprofloxacin and Latex   History: Past Medical History:  Diagnosis Date   ADHD (attention deficit hyperactivity disorder)    GERD (gastroesophageal reflux disease)    Hyperthyroidism    Thyroid disease    Past Surgical History:  Procedure Laterality Date   BREAST BIOPSY Left 2005   neg   CESAREAN SECTION     x3   COLONOSCOPY N/A 11/19/2020   Procedure: COLONOSCOPY;  Surgeon: Midge Minium, MD;  Location: [facility];  Service: Endoscopy;  Laterality: N/A;   Family History  Problem Relation Age of Onset   Cancer Mother    Testicular cancer Father    Diabetes Maternal  Grandmother    Breast cancer Neg Hx    Social History   Socioeconomic History   Marital status: Married    Spouse name: Not on file   Number of children: 3   Years of education: Not on file   Highest education level: Not on file  Occupational History   Not on file  Tobacco Use   Smoking status: Former    Current packs/day: 0.00    Average packs/day: 0.5 packs/day for 30.0 years (15.0 ttl pk-yrs)    Types: Cigarettes    Start date: 40    Quit date: 16    Years since quitting: 34.6   Smokeless tobacco: Never   Vaping Use   Vaping status: Never Used  Substance and Sexual Activity   Alcohol use: Yes    Alcohol/week: 1.0 standard drink of alcohol    Types: 1 Standard drinks or equivalent per week   Drug use: Never   Sexual activity: Yes  Other Topics Concern   Not on file  Social History Narrative   Pt eats plant based diet/vegan   Social Determinants of Health   Financial Resource Strain: Low Risk  (08/24/2022)   Overall Financial Resource Strain (CARDIA)    Difficulty of Paying Living Expenses: Not hard at all  Food Insecurity: No Food Insecurity (08/24/2022)   Hunger Vital Sign    Worried About Running Out of Food in the Last Year: Never true    Ran Out of Food in the Last Year: Never true  Transportation Needs: No Transportation Needs (08/24/2022)   PRAPARE - Administrator, Civil Service (Medical): No    Lack of Transportation (Non-Medical): No  Physical Activity: Sufficiently Active (08/24/2022)   Exercise Vital Sign    Days of Exercise per Week: 4 days    Minutes of Exercise per Session: 60 min  Stress: No Stress Concern Present (08/24/2022)   [employer] of Occupational Health - Occupational Stress Questionnaire    Feeling of Stress : Not at all  Social Connections: Moderately Isolated (08/24/2022)   Social Connection and Isolation Panel [NHANES]    Frequency of Communication with Friends and Family: More than three times a week    Frequency of Social Gatherings with Friends and Family: Twice a week    Attends Religious Services: Never    Database administrator or Organizations: No    Attends Engineer, structural: Never    Marital Status: Married    Tobacco Counseling Counseling given: Not Answered   Clinical Intake:  Pre-visit preparation completed: Yes  Pain : No/denies pain Pain Score: 0-No pain     Nutritional Risks: None Diabetes: No  How often do you need to have someone help you when you read instructions, pamphlets, or other written  materials from your doctor or pharmacy?: 1 - Never  Interpreter Needed?: No  Information entered by :: Kennedy Bucker, LPN   Activities of Daily Living    08/24/2022    9:34 AM 08/24/2022    8:51 AM  In your present state of health, do you have any difficulty performing the following activities:  Hearing? 0 1  Vision? 0 0  Difficulty concentrating or making decisions? 0 0  Walking or climbing stairs? 0 0  Dressing or bathing? 0 0  Doing errands, shopping? 0 0  Preparing Food and eating ? N N  Using the Toilet? N N  In the past six months, have you accidently leaked urine? N N  Do you have problems with loss of bowel control? N N  Managing your Medications? N N  Managing your Finances? N N  Housekeeping or managing your Housekeeping? N N    Patient Care Team: Reubin Milan, MD as PCP - General (Internal Medicine) Sherlon Handing, MD as Consulting Physician (Internal Medicine) Ward, Elenora Fender, MD as Referring Physician (Obstetrics and Gynecology)  Indicate any recent Medical Services you may have received from other than [facility] providers in the past year (date may be approximate).     Assessment:   This is a routine wellness examination for [place].  Hearing/Vision screen Hearing Screening - Comments:: One aid in right ear Vision Screening - Comments:: Readers- [facility]  Dietary issues and exercise activities discussed:     Goals Addressed             This Visit's Progress    DIET - EAT MORE FRUITS AND VEGETABLES         Depression Screen    08/24/2022    9:31 AM 06/30/2022   10:31 AM 08/17/2021   10:25 AM 08/17/2021   10:18 AM 08/17/2021   10:17 AM 05/02/2021    8:01 AM 01/10/2021   10:54 AM  PHQ 2/9 Scores  PHQ - 2 Score 0 0 0 0 0 0 0  PHQ- 9 Score 0 4    0 0    Fall Risk    08/24/2022    9:33 AM 08/24/2022    8:51 AM 06/30/2022   10:31 AM 08/17/2021   10:24 AM 08/16/2021   12:05 PM  Fall Risk   Falls in the past year? 1 1 0 1 1  Number  falls in past yr: 0 0 0 1 0  Injury with Fall? 0 1 0 1 1  Risk for fall due to : History of fall(s)  No Fall Risks History of fall(s)   Follow up Falls evaluation completed;Falls prevention discussed  Falls evaluation completed Falls evaluation completed;Falls prevention discussed     MEDICARE RISK AT HOME:  Medicare Risk at Home - 08/24/22 0934     Any stairs in or around the home? Yes    If so, are there any without handrails? No    Home free of loose throw rugs in walkways, pet beds, electrical cords, etc? Yes    Adequate lighting in your home to reduce risk of falls? Yes    Life alert? No    Use of a cane, walker or w/c? No    Grab bars in the bathroom? No    Shower chair or bench in shower? No    Elevated toilet seat or a handicapped toilet? No             TIMED UP AND GO:  Was the test performed?  No    Cognitive Function:        08/24/2022    9:34 AM 08/17/2021   10:27 AM  6CIT Screen  What Year? 0 points 0 points  What month? 0 points 0 points  What time? 0 points 0 points  Count back from 20 0 points 0 points  Months in reverse 0 points 0 points  Repeat phrase 0 points 0 points  Total Score 0 points 0 points    Immunizations Immunization History  Administered Date(s) Administered   Fluad Quad(high Dose 65+) 01/01/2020, 01/10/2021   Hepatitis A 10/27/2014   Hepatitis A, Adult 10/27/2014, 05/05/2015   Influenza,inj,Quad PF,6+ Mos 11/25/2018  Influenza-Unspecified 10/27/2014, 10/24/2018, 11/25/2018   PFIZER(Purple Top)SARS-COV-2 Vaccination 04/29/2019, 05/20/2019   PNEUMOCOCCAL CONJUGATE-20 05/10/2020   Tdap 04/08/2009, 06/30/2022   Zoster Recombinant(Shingrix) 12/17/2016, 12/09/2018   Zoster, Live 05/05/2015    TDAP status: Up to date  Flu Vaccine status: Declined, Education has been provided regarding the importance of this vaccine but patient still declined. Advised may receive this vaccine at local pharmacy or Health Dept. Aware to provide a copy  of the vaccination record if obtained from local pharmacy or Health Dept. Verbalized acceptance and understanding.  Pneumococcal vaccine status: Up to date  Covid-19 vaccine status: Completed vaccines  Qualifies for Shingles Vaccine? Yes   Zostavax completed Yes   Shingrix Completed?: Yes  Screening Tests Health Maintenance  Topic Date Due   COVID-19 Vaccine (3 - 2023-24 season) 09/23/2021   INFLUENZA VACCINE  08/24/2022   MAMMOGRAM  08/31/2022   Medicare Annual Wellness (AWV)  08/24/2023   Colonoscopy  11/20/2030   DTaP/Tdap/Td (3 - Td or Tdap) 06/29/2032   Pneumonia Vaccine 45+ Years old  Completed   DEXA SCAN  Completed   Hepatitis C Screening  Completed   Zoster Vaccines- Shingrix  Completed   HPV VACCINES  Aged Out    Health Maintenance  Health Maintenance Due  Topic Date Due   COVID-19 Vaccine (3 - 2023-24 season) 09/23/2021   INFLUENZA VACCINE  08/24/2022    Colorectal cancer screening: Type of screening: Colonoscopy. Completed 11/19/20. Repeat every 10 years  Mammogram status: Completed SCHEDULED FOR 09/06/22. Repeat every year  Bone Density status: Completed 01/09/19. Results reflect: Bone density results: OSTEOPENIA. Repeat every 5 years.  Lung Cancer Screening: (Low Dose CT Chest recommended if Age 42-80 years, 20 pack-year currently smoking OR have quit w/in 15years.) does not qualify.    Additional Screening:  Hepatitis C Screening: does qualify; Completed 05/05/15  Vision Screening: Recommended annual ophthalmology exams for early detection of glaucoma and other disorders of the eye. Is the patient up to date with their annual eye exam?  Yes  Who is the provider or what is the name of the office in which the patient attends annual eye exams? [facility] If pt is not established with a provider, would they like to be referred to a provider to establish care? No .   Dental Screening: Recommended annual dental exams for proper oral  hygiene    Community Resource Referral / Chronic Care Management: CRR required this visit?  No   CCM required this visit?  No     Plan:     I have personally reviewed and noted the following in the patient's chart:   Medical and social history Use of alcohol, tobacco or illicit drugs  Current medications and supplements including opioid prescriptions. Patient is not currently taking opioid prescriptions. Functional ability and status Nutritional status Physical activity Advanced directives List of other physicians Hospitalizations, surgeries, and ER visits in previous 12 months Vitals Screenings to include cognitive, depression, and falls Referrals and appointments  In addition, I have reviewed and discussed with patient certain preventive protocols, quality metrics, and best practice recommendations. A written personalized care plan for preventive services as well as general preventive health recommendations were provided to patient.     Hal Hope, LPN   01/25/2438   After Visit Summary: (MyChart) Due to this being a telephonic visit, the after visit summary with patients personalized plan was offered to patient via MyChart   Nurse Notes: none

## 2022-08-24 NOTE — Patient Instructions (Addendum)
Cassandra Hamilton , Thank you for taking time to come for your Medicare Wellness Visit. I appreciate your ongoing commitment to your health goals. Please review the following plan we discussed and let me know if I can assist you in the future.   Referrals/Orders/Follow-Ups/Clinician Recommendations: none  This is a list of the screening recommended for you and due dates:  Health Maintenance  Topic Date Due   COVID-19 Vaccine (3 - 2023-24 season) 09/23/2021   Flu Shot  08/24/2022   Mammogram  08/31/2022   Medicare Annual Wellness Visit  08/24/2023   Colon Cancer Screening  11/20/2030   DTaP/Tdap/Td vaccine (3 - Td or Tdap) 06/29/2032   Pneumonia Vaccine  Completed   DEXA scan (bone density measurement)  Completed   Hepatitis C Screening  Completed   Zoster (Shingles) Vaccine  Completed   HPV Vaccine  Aged Out    Advanced directives: (In Chart) A copy of your advanced directives are scanned into your chart should your provider ever need it.  Next Medicare Annual Wellness Visit scheduled for next year: Yes  08/28/23 @ 8:45 am by phone  Preventive Care 65 Years and Older, Female Preventive care refers to lifestyle choices and visits with your health care provider that can promote health and wellness. What does preventive care include? A yearly physical exam. This is also called an annual well check. Dental exams once or twice a year. Routine eye exams. Ask your health care provider how often you should have your eyes checked. Personal lifestyle choices, including: Daily care of your teeth and gums. Regular physical activity. Eating a healthy diet. Avoiding tobacco and drug use. Limiting alcohol use. Practicing safe sex. Taking low-dose aspirin every day. Taking vitamin and mineral supplements as recommended by your health care provider. What happens during an annual well check? The services and screenings done by your health care provider during your annual well check will depend on your  age, overall health, lifestyle risk factors, and family history of disease. Counseling  Your health care provider may ask you questions about your: Alcohol use. Tobacco use. Drug use. Emotional well-being. Home and relationship well-being. Sexual activity. Eating habits. History of falls. Memory and ability to understand (cognition). Work and work Astronomer. Reproductive health. Screening  You may have the following tests or measurements: Height, weight, and BMI. Blood pressure. Lipid and cholesterol levels. These may be checked every 5 years, or more frequently if you are over 45 years old. Skin check. Lung cancer screening. You may have this screening every year starting at age 22 if you have a 30-pack-year history of smoking and currently smoke or have quit within the past 15 years. Fecal occult blood test (FOBT) of the stool. You may have this test every year starting at age 17. Flexible sigmoidoscopy or colonoscopy. You may have a sigmoidoscopy every 5 years or a colonoscopy every 10 years starting at age 80. Hepatitis C blood test. Hepatitis B blood test. Sexually transmitted disease (STD) testing. Diabetes screening. This is done by checking your blood sugar (glucose) after you have not eaten for a while (fasting). You may have this done every 1-3 years. Bone density scan. This is done to screen for osteoporosis. You may have this done starting at age 72. Mammogram. This may be done every 1-2 years. Talk to your health care provider about how often you should have regular mammograms. Talk with your health care provider about your test results, treatment options, and if necessary, the need for more  tests. Vaccines  Your health care provider may recommend certain vaccines, such as: Influenza vaccine. This is recommended every year. Tetanus, diphtheria, and acellular pertussis (Tdap, Td) vaccine. You may need a Td booster every 10 years. Zoster vaccine. You may need this after  age 64. Pneumococcal 13-valent conjugate (PCV13) vaccine. One dose is recommended after age 65. Pneumococcal polysaccharide (PPSV23) vaccine. One dose is recommended after age 14. Talk to your health care provider about which screenings and vaccines you need and how often you need them. This information is not intended to replace advice given to you by your health care provider. Make sure you discuss any questions you have with your health care provider. Document Released: 02/05/2015 Document Revised: 09/29/2015 Document Reviewed: 11/10/2014 Elsevier Interactive Patient Education  2017 [employer].  Fall Prevention in the Home Falls can cause injuries. They can happen to people of all ages. There are many things you can do to make your home safe and to help prevent falls. What can I do on the outside of my home? Regularly fix the edges of walkways and driveways and fix any cracks. Remove anything that might make you trip as you walk through a door, such as a raised step or threshold. Trim any bushes or trees on the path to your home. Use bright outdoor lighting. Clear any walking paths of anything that might make someone trip, such as rocks or tools. Regularly check to see if handrails are loose or broken. Make sure that both sides of any steps have handrails. Any raised decks and porches should have guardrails on the edges. Have any leaves, snow, or ice cleared regularly. Use sand or salt on walking paths during winter. Clean up any spills in your garage right away. This includes oil or grease spills. What can I do in the bathroom? Use night lights. Install grab bars by the toilet and in the tub and shower. Do not use towel bars as grab bars. Use non-skid mats or decals in the tub or shower. If you need to sit down in the shower, use a plastic, non-slip stool. Keep the floor dry. Clean up any water that spills on the floor as soon as it happens. Remove soap buildup in the tub or shower  regularly. Attach bath mats securely with double-sided non-slip rug tape. Do not have throw rugs and other things on the floor that can make you trip. What can I do in the bedroom? Use night lights. Make sure that you have a light by your bed that is easy to reach. Do not use any sheets or blankets that are too big for your bed. They should not hang down onto the floor. Have a firm chair that has side arms. You can use this for support while you get dressed. Do not have throw rugs and other things on the floor that can make you trip. What can I do in the kitchen? Clean up any spills right away. Avoid walking on wet floors. Keep items that you use a lot in easy-to-reach places. If you need to reach something above you, use a strong step stool that has a grab bar. Keep electrical cords out of the way. Do not use floor polish or wax that makes floors slippery. If you must use wax, use non-skid floor wax. Do not have throw rugs and other things on the floor that can make you trip. What can I do with my stairs? Do not leave any items on the stairs. Make sure  that there are handrails on both sides of the stairs and use them. Fix handrails that are broken or loose. Make sure that handrails are as long as the stairways. Check any carpeting to make sure that it is firmly attached to the stairs. Fix any carpet that is loose or worn. Avoid having throw rugs at the top or bottom of the stairs. If you do have throw rugs, attach them to the floor with carpet tape. Make sure that you have a light switch at the top of the stairs and the bottom of the stairs. If you do not have them, ask someone to add them for you. What else can I do to help prevent falls? Wear shoes that: Do not have high heels. Have rubber bottoms. Are comfortable and fit you well. Are closed at the toe. Do not wear sandals. If you use a stepladder: Make sure that it is fully opened. Do not climb a closed stepladder. Make sure that  both sides of the stepladder are locked into place. Ask someone to hold it for you, if possible. Clearly mark and make sure that you can see: Any grab bars or handrails. First and last steps. Where the edge of each step is. Use tools that help you move around (mobility aids) if they are needed. These include: Canes. Walkers. Scooters. Crutches. Turn on the lights when you go into a dark area. Replace any light bulbs as soon as they burn out. Set up your furniture so you have a clear path. Avoid moving your furniture around. If any of your floors are uneven, fix them. If there are any pets around you, be aware of where they are. Review your medicines with your doctor. Some medicines can make you feel dizzy. This can increase your chance of falling. Ask your doctor what other things that you can do to help prevent falls. This information is not intended to replace advice given to you by your health care provider. Make sure you discuss any questions you have with your health care provider. Document Released: 11/05/2008 Document Revised: 06/17/2015 Document Reviewed: 02/13/2014 Elsevier Interactive Patient Education  2017 [employer].

## 2022-09-06 ENCOUNTER — Ambulatory Visit
Admission: RE | Admit: 2022-09-06 | Discharge: 2022-09-06 | Disposition: A | Discharge status: Home / Self Care | Payer: Medicare Other | Source: Ambulatory Visit | Attending: Internal Medicine | Admitting: Internal Medicine

## 2022-09-06 DIAGNOSIS — Z1231 Encounter for screening mammogram for malignant neoplasm of breast: Secondary | ICD-10-CM | POA: Insufficient documentation

## 2022-11-06 ENCOUNTER — Ambulatory Visit (INDEPENDENT_AMBULATORY_CARE_PROVIDER_SITE_OTHER): Payer: Medicare Other | Admitting: Internal Medicine

## 2022-11-06 VITALS — BP 106/78 | HR 72 | Ht 66.0 in | Wt 146.0 lb

## 2022-11-06 DIAGNOSIS — R350 Frequency of micturition: Secondary | ICD-10-CM

## 2022-11-06 LAB — POCT URINALYSIS DIPSTICK
Bilirubin, UA: NEGATIVE
Blood, UA: NEGATIVE
Glucose, UA: NEGATIVE
Ketones, UA: NEGATIVE
Nitrite, UA: NEGATIVE
Protein, UA: NEGATIVE
Spec Grav, UA: 1.015 (ref 1.010–1.025)
Urobilinogen, UA: 0.2 U/dL
pH, UA: 6 (ref 5.0–8.0)

## 2022-11-06 MED ORDER — CEFUROXIME AXETIL 250 MG PO TABS
250.0000 mg | ORAL_TABLET | Freq: Two times a day (BID) | ORAL | 0 refills | Status: AC
Start: 2022-11-06 — End: 2022-11-13

## 2022-11-06 NOTE — Progress Notes (Signed)
Date:  11/06/2022   Name:  Cassandra Hamilton   DOB:  26-Dec-1955   MRN:  [id number]   Chief Complaint: Urinary Tract Infection (Bladder pressure, urinary frequency. Started last week.)  Urinary Tract Infection  This is a new problem. The current episode started in the past 7 days. The problem occurs every urination. The problem has been gradually worsening. The quality of the pain is described as burning. The pain is mild. There has been no fever. Associated symptoms include frequency and urgency. Pertinent negatives include no chills, nausea or vomiting.  She took Bactrim for 5 days until 2 days ago for finger surgery but the UTI sx have not improved.  Review of Systems  Constitutional:  Negative for chills, fatigue and fever.  Respiratory:  Negative for chest tightness and shortness of breath.   Cardiovascular:  Negative for chest pain.  Gastrointestinal:  Negative for nausea and vomiting.  Genitourinary:  Positive for frequency and urgency. Negative for dysuria.  Psychiatric/Behavioral:  Negative for dysphoric mood and sleep disturbance. The patient is not nervous/anxious.      Lab Results  Component Value Date   NA 139 06/30/2022   K 4.2 06/30/2022   CO2 23 06/30/2022   GLUCOSE 93 06/30/2022   BUN 12 06/30/2022   CREATININE 0.70 06/30/2022   CALCIUM 9.2 06/30/2022   EGFR 95 06/30/2022   GFRNONAA 95 04/29/2019   Lab Results  Component Value Date   CHOL 235 (H) 06/30/2022   HDL 49 06/30/2022   LDLCALC 154 (H) 06/30/2022   TRIG 174 (H) 06/30/2022   CHOLHDL 4.8 (H) 06/30/2022   Lab Results  Component Value Date   TSH 2.56 04/12/2022   No results found for: "HGBA1C" Lab Results  Component Value Date   WBC 7.2 06/30/2022   HGB 14.6 06/30/2022   HCT 42.2 06/30/2022   MCV 96 06/30/2022   PLT 291 06/30/2022   Lab Results  Component Value Date   ALT 14 06/30/2022   AST 17 06/30/2022   ALKPHOS 105 06/30/2022   BILITOT 0.5 06/30/2022   Lab Results  Component Value  Date   VD25OH 33.4 06/30/2022     Patient Active Problem List   Diagnosis Date Noted   Vitamin D deficiency 06/30/2022   Subclinical hypothyroidism 04/29/2020   History of cold sores 06/11/2019   Hx of abnormal cervical Pap smear 05/08/2019   Mixed hyperlipidemia 12/25/2018   Gastroesophageal reflux disease with esophagitis without hemorrhage 12/25/2018   Primary osteoarthritis of both hands 12/25/2018   Tinnitus of both ears 12/06/2017    Allergies  Allergen Reactions   Ciprofloxacin     Rash    Latex Rash    Past Surgical History:  Procedure Laterality Date   BREAST BIOPSY Left 2005   neg   CESAREAN SECTION     x3   COLONOSCOPY N/A 11/19/2020   Procedure: COLONOSCOPY;  Surgeon: Midge Minium, MD;  [facility];  Service: Endoscopy;  Laterality: N/A;    Social History   Tobacco Use   Smoking status: Former    Current packs/day: 0.00    Average packs/day: 0.5 packs/day for 30.0 years (15.0 ttl pk-yrs)    Types: Cigarettes    Start date: 24    Quit date: 46    Years since quitting: 34.8   Smokeless tobacco: Never  Vaping Use   Vaping status: Never Used  Substance Use Topics   Alcohol use: Yes    Alcohol/week: 1.0 standard drink  of alcohol    Types: 1 Standard drinks or equivalent per week   Drug use: Never     Medication list has been reviewed and updated.  Current Meds  Medication Sig   cefUROXime (CEFTIN) 250 MG tablet Take 1 tablet (250 mg total) by mouth 2 (two) times daily with a meal for 7 days.   Cholecalciferol 50 MCG (2000 UT) TABS Take by mouth.   CRANBERRY EXTRACT PO Take by mouth.   cyanocobalamin (VITAMIN B12) 500 MCG tablet Take 500 mcg by mouth daily.   diazepam (VALIUM) 5 MG tablet Take 1 tablet (5 mg total) by mouth every 8 (eight) hours as needed for anxiety.   estradiol (ESTRACE) 0.1 MG/GM vaginal cream as needed.   levothyroxine (SYNTHROID) 50 MCG tablet Take 50 mcg by mouth daily before breakfast.   valACYclovir  (VALTREX) 1000 MG tablet Take 1 tablet (1,000 mg total) by mouth 2 (two) times daily as needed.       11/06/2022   10:47 AM 06/30/2022   10:31 AM 05/02/2021    8:02 AM 01/10/2021   10:54 AM  GAD 7 : Generalized Anxiety Score  Nervous, Anxious, on Edge 2 1 0 0  Control/stop worrying 0 1 0 0  Worry too much - different things 3 1 0 0  Trouble relaxing 2 1 0 0  Restless 2 1 0 0  Easily annoyed or irritable 1 0 0 0  Afraid - awful might happen 2 1 0 0  Total GAD 7 Score 12 6 0 0  Anxiety Difficulty Somewhat difficult Somewhat difficult Not difficult at all Not difficult at all       11/06/2022   10:47 AM 08/24/2022    9:31 AM 06/30/2022   10:31 AM  Depression screen PHQ 2/9  Decreased Interest 0 0 0  Down, Depressed, Hopeless 0 0 0  PHQ - 2 Score 0 0 0  Altered sleeping 3 0 2  Tired, decreased energy 1 0 1  Change in appetite 0 0 0  Feeling bad or failure about yourself  0 0 1  Trouble concentrating 0 0 0  Moving slowly or fidgety/restless 2 0 0  Suicidal thoughts 0 0 0  PHQ-9 Score 6 0 4  Difficult doing work/chores Not difficult at all Not difficult at all Not difficult at all    BP Readings from Last 3 Encounters:  11/06/22 106/78  06/30/22 102/64  05/02/21 112/70    Physical Exam Vitals and nursing note reviewed.  Constitutional:      Appearance: She is well-developed.  Cardiovascular:     Rate and Rhythm: Normal rate and regular rhythm.     Heart sounds: Normal heart sounds.  Pulmonary:     Effort: Pulmonary effort is normal. No respiratory distress.     Breath sounds: Normal breath sounds.  Abdominal:     General: Bowel sounds are normal.     Palpations: Abdomen is soft.     Tenderness: There is abdominal tenderness in the suprapubic area. There is no right CVA tenderness, left CVA tenderness, guarding or rebound.  Skin:    Capillary Refill: Capillary refill takes less than 2 seconds.    Lab Results  Component Value Date   COLORU yellow 11/06/2022    CLARITYU clear 11/06/2022   GLUCOSEUR Negative 11/06/2022   BILIRUBINUR neg 11/06/2022   KETONESU neg 11/06/2022   SPECGRAV 1.015 11/06/2022   RBCUR neg 11/06/2022   PHUR 6.0 11/06/2022   PROTEINUR Negative 11/06/2022  UROBILINOGEN 0.2 11/06/2022   LEUKOCYTESUR Moderate (2+) (A) 11/06/2022     Wt Readings from Last 3 Encounters:  11/06/22 146 lb (66.2 kg)  08/24/22 146 lb (66.2 kg)  06/30/22 146 lb (66.2 kg)    BP 106/78   Pulse 72   Ht 5\' 6"  (1.676 m)   Wt 146 lb (66.2 kg)   SpO2 97%   BMI 23.57 kg/m   Assessment and Plan:  Problem List Items Addressed This Visit   None Visit Diagnoses     Urinary frequency    -  Primary   continue Cranberry tablets daily Push fluids start Ceftin and send culture   Relevant Medications   cefUROXime (CEFTIN) 250 MG tablet   Other Relevant Orders   POCT Urinalysis Dipstick (Completed)   Urine Culture       No follow-ups on file.    Reubin Milan, MD [facility] [place]

## 2022-11-08 LAB — URINE CULTURE

## 2022-11-13 ENCOUNTER — Telehealth: Payer: Self-pay | Admitting: Internal Medicine

## 2022-11-13 ENCOUNTER — Other Ambulatory Visit: Payer: Self-pay

## 2022-11-13 ENCOUNTER — Ambulatory Visit: Payer: Self-pay

## 2022-11-13 ENCOUNTER — Other Ambulatory Visit: Payer: Self-pay | Admitting: Internal Medicine

## 2022-11-13 DIAGNOSIS — N3000 Acute cystitis without hematuria: Secondary | ICD-10-CM

## 2022-11-13 DIAGNOSIS — R350 Frequency of micturition: Secondary | ICD-10-CM

## 2022-11-13 MED ORDER — NITROFURANTOIN MONOHYD MACRO 100 MG PO CAPS: 100.0000 mg | ORAL_CAPSULE | Freq: Two times a day (BID) | ORAL | 0 refills | Status: AC

## 2022-11-13 MED ORDER — SULFAMETHOXAZOLE-TRIMETHOPRIM 800-160 MG PO TABS
1.0000 | ORAL_TABLET | Freq: Two times a day (BID) | ORAL | 0 refills | Status: DC
Start: 2022-11-13 — End: 2022-11-13

## 2022-11-13 NOTE — Telephone Encounter (Signed)
  Chief Complaint: UTI not resolved.  Symptoms: Frequent urination, pain Frequency: seen on 10/14 for same s/s Pertinent Negatives: Patient denies  Disposition: [] ED /[] Urgent Care (no appt availability in office) / [] Appointment(In office/virtual)/ []  [facility]/ [] Home Care/ [] Refused Recommended Disposition /[] [place] [facility]/  Pt was seen for UTI on 11/06/2022. Pt has 1 tablet left of ABX prescribed. Pt states that S/S have not resolved. Pt would like a different abx prescribed. Pt would like  a call back from office when rx is called in.     Summary: UTI   Patient was seen 10/14 for UTI and states she's experiencing pain around bladder, frequent urination. Patient does not want a refill of cefUROXime (CEFTIN) 250 MG tablet but would like PCP to prescribe another medication.         Reason for Disposition  [1] Taking antibiotic > 72 hours (3 days) for UTI AND [2] painful urination or frequency is SAME (unchanged, not better)  Answer Assessment - Initial Assessment Questions 1. MAIN SYMPTOM: "What is the main symptom you are concerned about?" (e.g., painful urination, urine frequency)     Frequent urination, pain 2. BETTER-SAME-WORSE: "Are you getting better, staying the same, or getting worse compared to how you felt at your last visit to the doctor (most recent medical visit)?"     A little better 3. PAIN: "How bad is the pain?"  (e.g., Scale 1-10; mild, moderate, or severe)   - MILD (1-3): complains slightly about urination hurting   - MODERATE (4-7): interferes with normal activities     - SEVERE (8-10): excruciating, unwilling or unable to urinate because of the pain      moderate 6. DIAGNOSIS: "When was the UTI diagnosed?" "By whom?" "Was it a kidney infection, bladder infection or both?"     Dr. Judithann Graves 7. ANTIBIOTIC: "What antibiotic(s) are you taking?" "How many times per day?"     Cefuroxime 8. ANTIBIOTIC - START DATE: "When did you start taking the  antibiotic?"     11/06/2022  Protocols used: Urinary Tract Infection on Antibiotic Follow-up Call - [facility]

## 2022-11-13 NOTE — Telephone Encounter (Signed)
Called and spoke with patient about provider's message about changing medication. Patient verbal understanding.

## 2022-11-13 NOTE — Telephone Encounter (Signed)
Pt wants another medication sent in she stated she tried bactrim and it didn't help.  KP

## 2022-11-13 NOTE — Telephone Encounter (Signed)
Please review.  KP

## 2022-11-13 NOTE — Telephone Encounter (Signed)
Called pt left VM to call back. Please read pt message above.  KP

## 2022-11-13 NOTE — Telephone Encounter (Signed)
Copied from CRM [id number]. Topic: Referral - Status >> Nov 13, 2022  1:54 PM Franchot Heidelberg wrote: Pt contacted her urologist, they told her to contact her PCP and make sure that her referral gets submitted. [facility] office in [place].

## 2022-11-13 NOTE — Telephone Encounter (Signed)
Referral is placed someone will call to schedule an appointment.  KP

## 2022-11-27 ENCOUNTER — Other Ambulatory Visit: Payer: Self-pay | Admitting: *Deleted

## 2022-11-27 DIAGNOSIS — R350 Frequency of micturition: Secondary | ICD-10-CM

## 2022-11-28 ENCOUNTER — Other Ambulatory Visit
Admission: RE | Admit: 2022-11-28 | Discharge: 2022-11-28 | Disposition: A | Discharge status: Home / Self Care | Payer: Medicare Other | Attending: Urology | Admitting: Urology

## 2022-11-28 ENCOUNTER — Encounter: Payer: Self-pay | Admitting: Urology

## 2022-11-28 ENCOUNTER — Encounter: Payer: Self-pay | Admitting: Internal Medicine

## 2022-11-28 ENCOUNTER — Ambulatory Visit: Payer: Medicare Other | Admitting: Urology

## 2022-11-28 VITALS — BP 118/76 | HR 85 | Ht 66.0 in | Wt 148.0 lb

## 2022-11-28 DIAGNOSIS — R399 Unspecified symptoms and signs involving the genitourinary system: Secondary | ICD-10-CM | POA: Diagnosis not present

## 2022-11-28 DIAGNOSIS — R350 Frequency of micturition: Secondary | ICD-10-CM | POA: Diagnosis present

## 2022-11-28 DIAGNOSIS — R1033 Periumbilical pain: Secondary | ICD-10-CM | POA: Insufficient documentation

## 2022-11-28 DIAGNOSIS — R102 Pelvic and perineal pain: Secondary | ICD-10-CM | POA: Diagnosis not present

## 2022-11-28 LAB — URINALYSIS, COMPLETE (UACMP) WITH MICROSCOPIC
Bilirubin Urine: NEGATIVE
Glucose, UA: NEGATIVE mg/dL
Ketones, ur: NEGATIVE mg/dL
Nitrite: NEGATIVE
Protein, ur: NEGATIVE mg/dL
Specific Gravity, Urine: 1.025 (ref 1.005–1.030)
pH: 5.5 (ref 5.0–8.0)

## 2022-11-28 MED ORDER — CELECOXIB 200 MG PO CAPS: 200.0000 mg | ORAL_CAPSULE | Freq: Two times a day (BID) | ORAL | 0 refills | Status: DC

## 2022-11-28 MED ORDER — ESTRADIOL 0.1 MG/GM VA CREA: TOPICAL_CREAM | VAGINAL | 11 refills | Status: DC

## 2022-11-28 NOTE — Patient Instructions (Addendum)
Interstitial Cystitis  Interstitial cystitis (IC) is inflammation of the bladder. It's also called painful bladder syndrome. It can cause pain near your bladder. It can also make you have to pee urgently and often. IC may flare up and then go away for a while. In some cases, it may become a long-term (chronic) problem. What are the causes? The cause of IC isn't known. What increases the risk? You may be more likely to get IC if: You're female. You have certain other conditions. These include: Fibromyalgia. Irritable bowel syndrome (IBS). Endometriosis. Chronic fatigue syndrome. You may have worse symptoms if: You're under a lot of stress. You smoke. You have certain foods or drinks. What are the signs or symptoms? Your symptoms may change over time. They may include: Discomfort or pain near your bladder. The pain may range from mild to very bad. You may have more or less pain as your bladder fills with pee and empties. Pain in your pelvic area. This is the area between your hip bones. Needing to pee often or all the time. Pain when you pee. Pain during sex. Blood in your pee. Tiredness. If you're female, your symptoms may get worse when you have your menstrual period. How is this diagnosed? IC is diagnosed based on your symptoms, your medical history, and an exam. Your health care provider may need to rule out other conditions. They may do tests, such as: Pee tests. A cystoscopy. This test looks at the inside of your bladder. Biopsy. This is when a small piece of tissue is removed from your bladder for testing. How is this treated? There's no cure for IC. But treatment can help you manage your symptoms. Work with your provider to find the best treatments for you. These may include: Medicines to help with pain or to reduce how often you feel the need to pee. These may be given by mouth or put in your bladder using a soft tube called a catheter. Diet changes. Taking steps to  manage stress. Physical therapy. This may include: Exercises. These can help you relax your pelvic floor muscles. Massage. This may be done to relax tight muscles. Bladder training. This is when you learn ways to control when you pee. Neuromodulation therapy. This uses a device that's put on your back. It blocks the nerves that cause you to feel pain near your bladder. A procedure to stretch your bladder. This may be done by filling your bladder with air or fluid. Surgery. This is rare. It's only done if other treatments don't help. Follow these instructions at home: Eating and drinking Make changes to your diet as told by your provider. You may need to avoid: Spicy foods. Foods with acid in them, like tomatoes or citrus. Foods with a lot of potassium in them. Avoid drinking alcohol and caffeine. These drinks can make you have to pee more. Lifestyle Learn and practice ways to relax. These may include deep breathing and muscle relaxation. Get care for your mental well-being. You may need to: Do cognitive behavioral therapy (CBT). This therapy can change the way you think or act in response to things. It may help you feel better. See a therapist if you're depressed. Work with your provider on other ways to manage pain. Acupuncture may help. Do not use any products that contain nicotine or tobacco. These products include cigarettes, chewing tobacco, and vaping devices, such as e-cigarettes. If you need help quitting, ask your provider. Bladder training  Do bladder training as told. You  may need to: Pee at set, regular times. Train yourself to delay peeing. Keep a bladder diary. Write down: The times you pee. Any symptoms you have. The diary can help you find out what makes your symptoms worse. Use the diary to schedule times to pee. If you're away from home, plan to be near a bathroom at those times. Make sure you pee: Just before you leave the house. Just before you go to  bed. General instructions Take over-the-counter and prescription medicines only as told by your provider. Try putting a warm or cool cloth called a compress over your bladder. This can help with pain. Avoid wearing tight clothes. Do exercises as told by your provider. Where to find more information [employer]: urologyhealth.org Interstitial Cystitis Association (ICA): [employer] Contact a health care provider if: Your symptoms don't get better with treatment. Your pain or discomfort gets worse. You have to pee more often. You have little to no control over when you pee. You have a fever or chills. This information is not intended to replace advice given to you by your health care provider. Make sure you discuss any questions you have with your health care provider. Document Revised: 04/15/2022 Document Reviewed: 04/15/2022 Elsevier Patient Education  2024 Elsevier Inc.  Eating Plan for Interstitial Cystitis Interstitial cystitis (IC) is a long-term (chronic) condition that can cause pain and pressure near your bladder. It can also make you have to pee urgently and often. Symptoms may come and go. Certain foods may trigger your symptoms. Learning what foods bother you can help you come up with an eating plan to manage IC. What are tips for following this plan? You may want to work with an expert in healthy eating called a dietitian. They can help you make an eating plan by doing an elimination diet. This diet involves: Making a list of foods that you think trigger your symptoms. You may also want to include foods that often cause symptoms in other people with IC. It may take a few months to find out which foods bother you. Taking those foods out of your diet for about a month. After that month, you can try to have the foods again one at a time to see which ones cause symptoms. Reading food labels Once you know which foods trigger your symptoms, you can avoid them. But it's  still a good idea to read food labels. Some foods that cause your symptoms may be ingredients in other foods. These foods may include: Soy. Worcestershire sauce. Vinegar. Alcohol. Artificial sweeteners. Monosodium glutamate. Other triggers may include: Chili peppers. Tomato products. Citrus fruits, flavors, or juices. Shopping Shopping can be hard if many foods trigger your IC. Bring a list of the foods you can't eat with you when you go to the store. You can get an app for your phone that lets you know which foods are safer and which ones you may want to avoid. You can find the app at the [employer] website: ic-network.com Meal planning Plan your meals based on the results of your elimination diet. If you haven't done the diet yet, plan meals based on IC food lists. These lists may be given to you by your health care provider or dietitian. The lists tell you which foods are least and most likely to cause symptoms. Avoid certain types of food when you go out to eat. These may include: Pizza. [place] food. [place] food. [place] food. General information Do not eat large portions. Drink lots  of fluids with your meals. Do not eat foods that are high in sugar, salt, or saturated fat. Choose whole fruits instead of juice. Eat a colorful variety of vegetables. Find ways to manage stress. Get enough exercise. What foods should I eat? For people with IC, the best diet is a balanced one. This means it includes things from all the food groups. Even if you have to avoid certain foods, there are still lots of healthy choices in each group. Below are some foods that may be safest for you to eat: Fruits Bananas. Blueberries and blueberry juice. Melons. Pears. Apples. Dates. Prunes. Raisins. Apricots. Vegetables Asparagus. Avocado. Celery. Beets. Bell peppers. Black olives. Broccoli. Brussels sprouts. Cabbage. Carrots. Cauliflower. Cucumber. Eggplant. Green beans. Potatoes. Radishes. Spinach. Squash.  Turnips. Zucchini. Mushrooms. Peas. Grains Oats. Rice. Bran. Oatmeal. Whole wheat bread. Meats and other proteins Beef. Fish and other seafood. Eggs. Nuts. Peanut butter. Pork. Poultry. Lamb. Garbanzo beans. Pinto beans. Dairy Whole or low-fat milk. American, mozzarella, mild cheddar, feta, ricotta, and cream cheeses. The items listed above may not be all the foods and drinks you can have. Talk to a dietitian to learn more. What foods should I avoid? You should avoid any foods that cause symptoms. It's also a good idea to avoid foods that often cause symptoms in many people with IC. These foods include: Fruits Citrus fruits, such as lemons, limes, oranges, and grapefruit. Cranberries. Strawberries. Pineapple. Kiwi. Vegetables Chili peppers. Onions. Sauerkraut. Tomato and tomato products. Rosita Fire. Grains You don't need to avoid any type of grain unless it causes symptoms. Meats and other proteins Precooked or cured meats, such as sausages or meat loaves. Soy products. Dairy Chocolate ice cream. Processed cheese. Yogurt. Drinks Alcohol. Chocolate drinks. Coffee. Cranberry juice. Fizzy drinks. Black, green, or herbal tea. Tomato juice. Sports drinks. The items listed above may not be all the foods and drinks you should avoid. Talk to a dietitian to learn more. This information is not intended to replace advice given to you by your health care provider. Make sure you discuss any questions you have with your health care provider. Document Revised: 04/20/2022 Document Reviewed: 04/20/2022 Elsevier Patient Education  2024 Elsevier Inc.  Pelvic Floor Dysfunction, Female  Pelvic floor dysfunction (PFD) is a condition that results when the group of muscles and connective tissues that support the organs in the pelvis (pelvic floor muscles) do not work well. These muscles and their connections form a sling that supports the colon and bladder. In women, they also support the uterus. PFD causes  pelvic floor muscles to be too weak, too tight, or both. In PFD, muscle movements are not coordinated. This may cause bowel or bladder problems. It may also cause pain. What are the causes? This condition may be caused by an injury to the pelvic area or by a weakening of pelvic muscles. This often results from pregnancy and childbirth or other types of strain. In many cases, the exact cause is not known. What increases the risk? The following factors may make you more likely to develop this condition: Having chronic bladder tissue inflammation (interstitial cystitis). Being an older person. Being overweight. History of radiation treatment for cancer in the pelvic region. Previous pelvic surgery, such as removal of the uterus (hysterectomy). What are the signs or symptoms? Symptoms of this condition vary and may include: Bladder symptoms, such as: Trouble starting urination and emptying the bladder. Frequent urinary tract infections. Leaking urine when coughing, laughing, or exercising (stress incontinence). Having to pass  urine urgently or frequently. Pain when passing urine. Bowel symptoms, such as: Constipation. Urgent or frequent bowel movements. Incomplete bowel movements. Painful bowel movements. Leaking stool or gas. Unexplained genital or rectal pain. Genital or rectal muscle spasms. Low back pain. Other symptoms may include: A heavy, full, or aching feeling in the vagina. A bulge that protrudes into the vagina. Pain during or after sex. How is this diagnosed? This condition may be diagnosed based on: Your symptoms and medical history. A physical exam. During the exam, your health care provider may check your pelvic muscles for tightness, spasm, pain, or weakness. This may include a rectal exam and a pelvic exam. In some cases, you may have diagnostic tests, such as: Electrical muscle function tests. Urine flow testing. X-ray tests of bowel function. Ultrasound of the  pelvic organs. How is this treated? Treatment for this condition depends on the symptoms. Treatment options include: Physical therapy. This may include Kegel exercises to help relax or strengthen the pelvic floor muscles. Biofeedback. This type of therapy provides feedback on how tight your pelvic floor muscles are so that you can learn to control them. Internal or external massage therapy. A treatment that involves electrical stimulation of the pelvic floor muscles to help control pain (transcutaneous electrical nerve stimulation, or TENS). Sound wave therapy (ultrasound) to reduce muscle spasms. Medicines, such as: Muscle relaxants. Bladder control medicines. Surgery to reconstruct or support pelvic floor muscles may be an option if other treatments do not help. Follow these instructions at home: Activity Do your usual activities as told by your health care provider. Ask your health care provider if you should modify any activities. Do pelvic floor strengthening or relaxing exercises at home as told by your physical therapist. Lifestyle Maintain a healthy weight. Eat foods that are high in fiber, such as beans, whole grains, and fresh fruits and vegetables. Limit foods that are high in fat and processed sugars, such as fried or sweet foods. Manage stress with relaxation techniques such as yoga or meditation. General instructions If you have problems with leakage: Use absorbable pads or wear padded underwear. Wash frequently with mild soap. Keep your genital and anal area as clean and dry as possible. Ask your health care provider if you should try a barrier cream to prevent skin irritation. Take warm baths to relieve pelvic muscle tension or spasms. Take over-the-counter and prescription medicines only as told by your health care provider. Keep all follow-up visits. How is this prevented? The cause of PFD is not always known, but there are a few things you can do to reduce the risk  of developing this condition, including: Staying at a healthy weight. Getting regular exercise. Managing stress. Contact a health care provider if: Your symptoms are not improving with home care. You have signs or symptoms of PFD that get worse at home. You develop new signs or symptoms. You have signs of a urinary tract infection, such as: Fever. Chills. Increased urinary frequency. A burning feeling when urinating. You have not had a bowel movement in 3 days (constipation). Summary Pelvic floor dysfunction results when the muscles and connective tissues in your pelvic floor do not work well. These muscles and their connections form a sling that supports your colon and bladder. In women, they also support the uterus. PFD may be caused by an injury to the pelvic area or by a weakening of pelvic muscles. PFD causes pelvic floor muscles to be too weak, too tight, or a combination of both. Symptoms  may vary from person to person. In most cases, PFD can be treated with physical therapies and medicines. Surgery may be an option if other treatments do not help. This information is not intended to replace advice given to you by your health care provider. Make sure you discuss any questions you have with your health care provider. Document Revised: 05/19/2020 Document Reviewed: 05/19/2020 Elsevier Patient Education  2024 [employer].

## 2022-11-28 NOTE — Progress Notes (Signed)
   11/28/22 3:44 PM   Cassandra Hamilton 1955/12/01 [id number]  CC: Pelvic pain, urinary symptoms, " recurrent UTI"  HPI: 67 year old female with anxiety who reports at least a few months of worsening urinary symptoms with pelvic pressure, urgency, intermittent dysuria, pelvic tightness.  Urine culture was negative, and she was treated with 4 different rounds of antibiotics over the last month with only some improvement.  She has been on estradiol cream long-term, but she typically only uses that 1 or 2 times a week and uses a dispenser to place that vaginally.  I do not see any prior positive urine cultures in her records.  No prior imaging to review.  She denies any gross hematuria.  Urinalysis today with 6-10 WBC, few bacteria, small leukocytes, will send for culture   PMH: Past Medical History:  Diagnosis Date   ADHD (attention deficit hyperactivity disorder)    GERD (gastroesophageal reflux disease)    Hyperthyroidism    Thyroid disease     Surgical History: Past Surgical History:  Procedure Laterality Date   BREAST BIOPSY Left 2005   neg   CESAREAN SECTION     x3   COLONOSCOPY N/A 11/19/2020   Procedure: COLONOSCOPY;  Surgeon: Midge Minium, MD;  Location[facility];  Service: Endoscopy;  Laterality: N/A;   Family History: Family History  Problem Relation Age of Onset   Cancer Mother    Testicular cancer Father    Diabetes Maternal Grandmother    Breast cancer Neg Hx     Social History:  reports that she quit smoking about 34 years ago. Her smoking use included cigarettes. She started smoking about 64 years ago. She has a 15 pack-year smoking history. She has been exposed to tobacco smoke. She has never used smokeless tobacco. She reports current alcohol use of about 1.0 standard drink of alcohol per week. She reports that she does not use drugs.  Physical Exam: BP 118/76   Pulse 85   Ht 5\' 6"  (1.676 m)   Wt 148 lb (67.1 kg)   BMI 23.89 kg/m     Constitutional:  Alert and oriented, No acute distress. Cardiovascular: No clubbing, cyanosis, or edema. Respiratory: Normal respiratory effort, no increased work of breathing. GI: Abdomen is soft, nontender, nondistended, no abdominal masses   Assessment & Plan:   67 year old female with pelvic pressure and discomfort, urinary symptoms of unclear etiology.  Cultures have been negative.    We discussed the complexities of pelvic pain and possible range of etiologies including genitourinary syndrome of menopause, pelvic floor dysfunction, chronic bladder pain syndrome, and interstitial cystitis.  We reviewed the AUA guidelines that recommend an algorithmic approach to treatment for these patients, and that a trial of different medications and strategies is sometimes needed to find the approach that works best for each patient's unique situation.  I reinforced the importance of stress management, relaxation, avoiding triggers, and pain management in the approach to pelvic pain.  -CT abdomen and pelvis for further evaluation of abdominal pain, evaluate stones -Counseled to try topical estrogen cream periurethrally 3 times weekly -Trial of Celebrex 200 mg twice daily -Pelvic floor stretching exercises provided -IC diet info provided -RTC 4 weeks symptom check, consider cystoscopy if no improvement  Legrand Rams, MD 11/28/2022  [facility] [address], [address] [place], [place] [address] [phone]

## 2022-11-29 LAB — URINE CULTURE: Culture: NO GROWTH

## 2022-12-13 ENCOUNTER — Ambulatory Visit: Payer: Medicare Other

## 2022-12-26 ENCOUNTER — Ambulatory Visit: Payer: Medicare Other | Admitting: Urology

## 2022-12-27 ENCOUNTER — Ambulatory Visit
Admission: RE | Admit: 2022-12-27 | Discharge: 2022-12-27 | Disposition: A | Discharge status: Home / Self Care | Payer: Medicare Other | Source: Ambulatory Visit | Attending: Urology | Admitting: Urology

## 2022-12-27 DIAGNOSIS — R1033 Periumbilical pain: Secondary | ICD-10-CM | POA: Diagnosis present

## 2022-12-27 MED ORDER — IOHEXOL 300 MG/ML  SOLN
100.0000 mL | Freq: Once | INTRAMUSCULAR | Status: AC | PRN
  Administered 2022-12-27: 100 mL via INTRAVENOUS

## 2023-01-03 ENCOUNTER — Encounter: Payer: Self-pay | Admitting: Urology

## 2023-01-03 ENCOUNTER — Ambulatory Visit (INDEPENDENT_AMBULATORY_CARE_PROVIDER_SITE_OTHER): Payer: Medicare Other | Admitting: Urology

## 2023-01-03 VITALS — BP 128/70 | HR 89 | Ht 66.0 in

## 2023-01-03 DIAGNOSIS — R102 Pelvic and perineal pain: Secondary | ICD-10-CM

## 2023-01-03 DIAGNOSIS — N958 Other specified menopausal and perimenopausal disorders: Secondary | ICD-10-CM

## 2023-01-03 NOTE — Progress Notes (Signed)
   01/03/2023 3:25 PM   Cassandra Hamilton 11-22-1955 [id number]  Reason for visit: Follow up urinary symptoms, pelvic pain  HPI: 67 year old female who I originally saw in November 2020 for for a number of symptoms including pelvic pressure, urgency, intermittent dysuria, and pelvic tightness.  Urine culture was negative, she had been treated with multiple different antibiotics, she had also been on estradiol cream long-term but was using that only intermittently and was placing it vaginally primarily.  Urine culture was negative at that initial visit.  Her clinical picture was most consistent with pelvic floor dysfunction/genitourinary syndrome of menopause.  I prescribed a 2-week course of Celebrex, and recommended using the estradiol 3 times weekly and applying periurethrally.  We also ordered a CT with her chronic abdominal pain, and I personally viewed and interpreted those images that showed no urologic abnormalities.  Formal read is pending.  She reports her symptoms have completely resolved.  She denies any complaints today.  I recommended continuing the topical estrogen cream with follow-up in 1 year  Continue topical estrogen cream Symptoms have resolved previously with course of Celebrex, consider in the future if acute flare of symptoms RTC 1 year  Sondra Come, MD  [facility] [address], [address] [place], [place] [address] [phone]

## 2023-02-04 IMAGING — MG MM DIGITAL SCREENING BILAT W/ TOMO AND CAD
8 series · 8 of 24 positions shown · non-contrast
Comparison: Previous exam(s).

CLINICAL DATA: Screening.

EXAM:
DIGITAL SCREENING BILATERAL MAMMOGRAM WITH TOMOSYNTHESIS AND CAD
TECHNIQUE: Bilateral screening digital craniocaudal and mediolateral oblique
mammograms were obtained. Bilateral screening digital breast
tomosynthesis was performed. The images were evaluated with
computer-aided detection.

[R CC synth-2D]
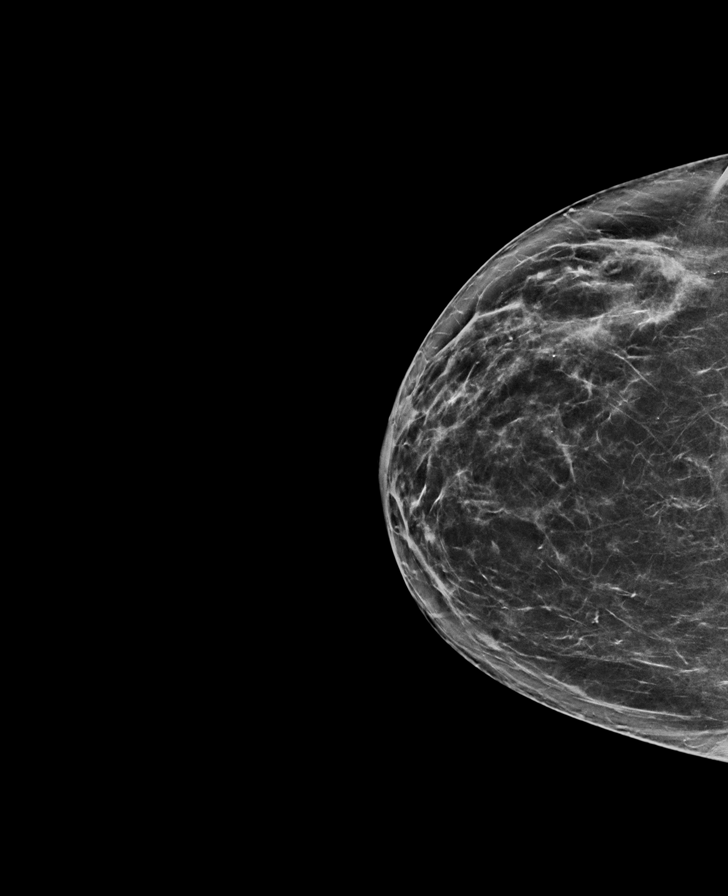

[R MLO synth-2D]
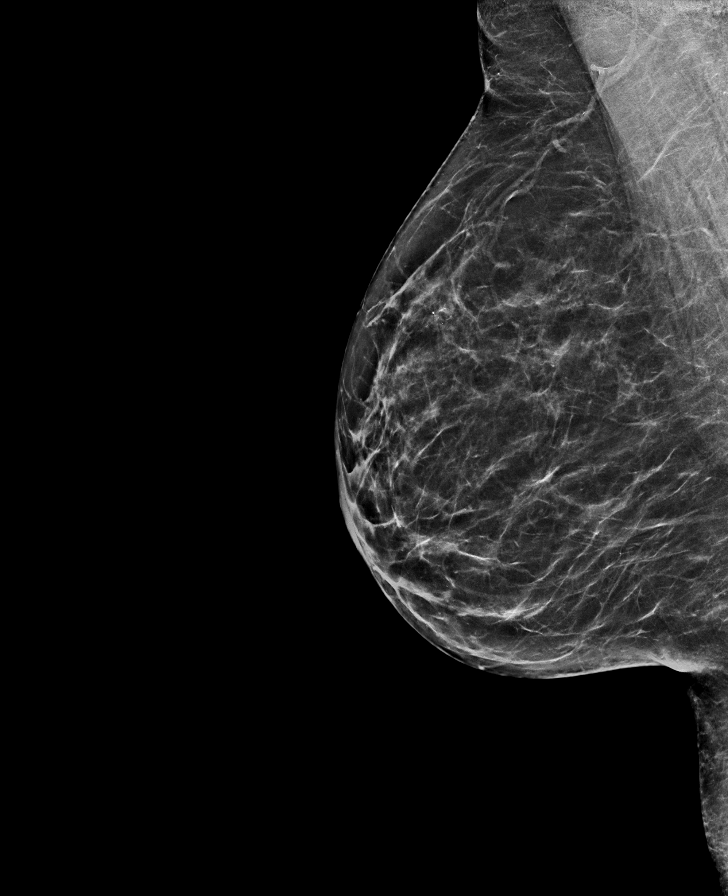

[L MLO synth-2D]
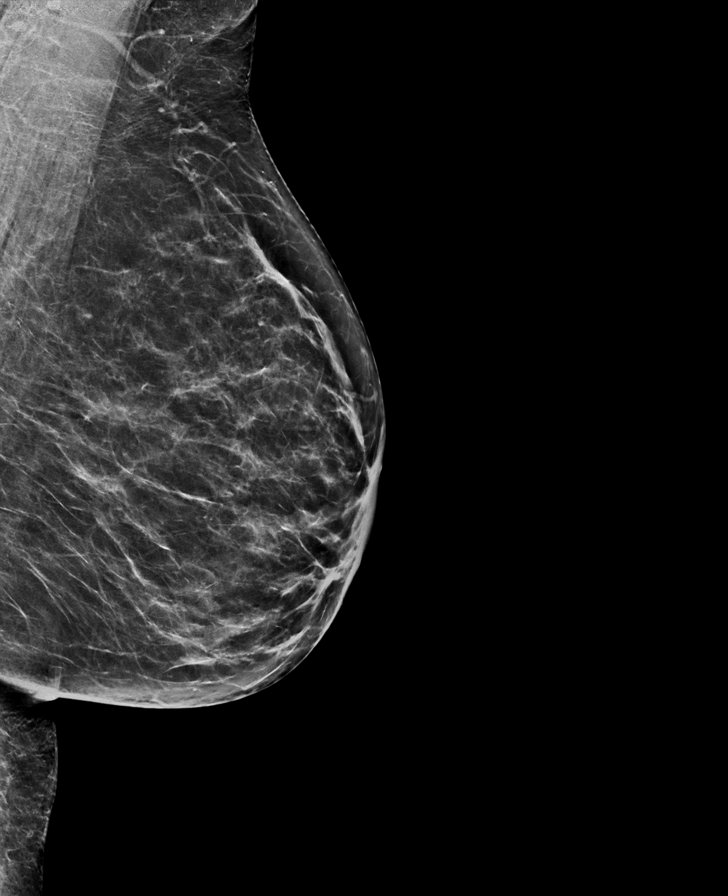

[L CC synth-2D]
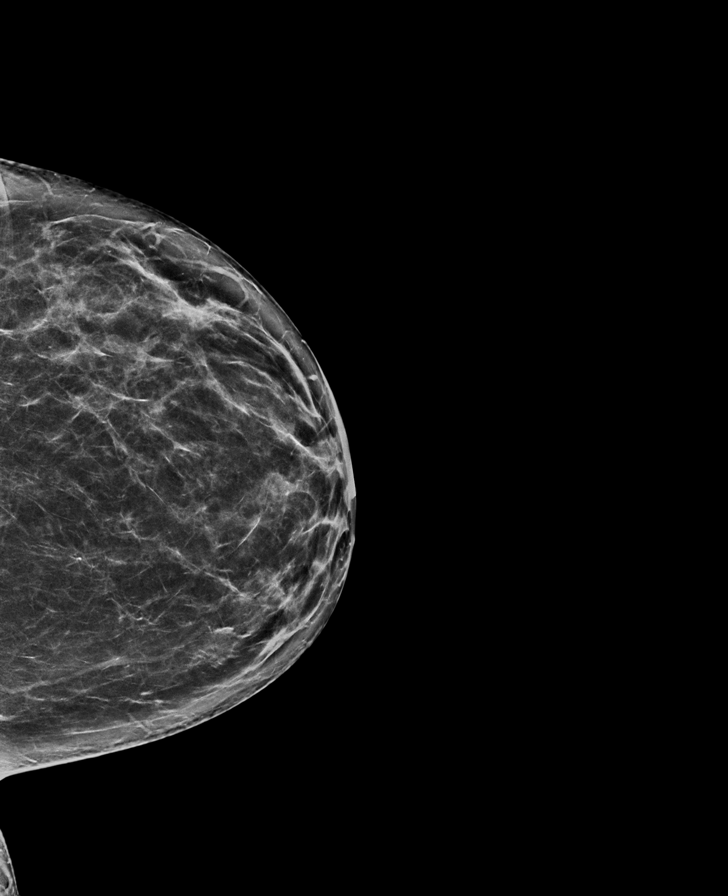

[R MLO tomo · tomo slice 34/67.0]
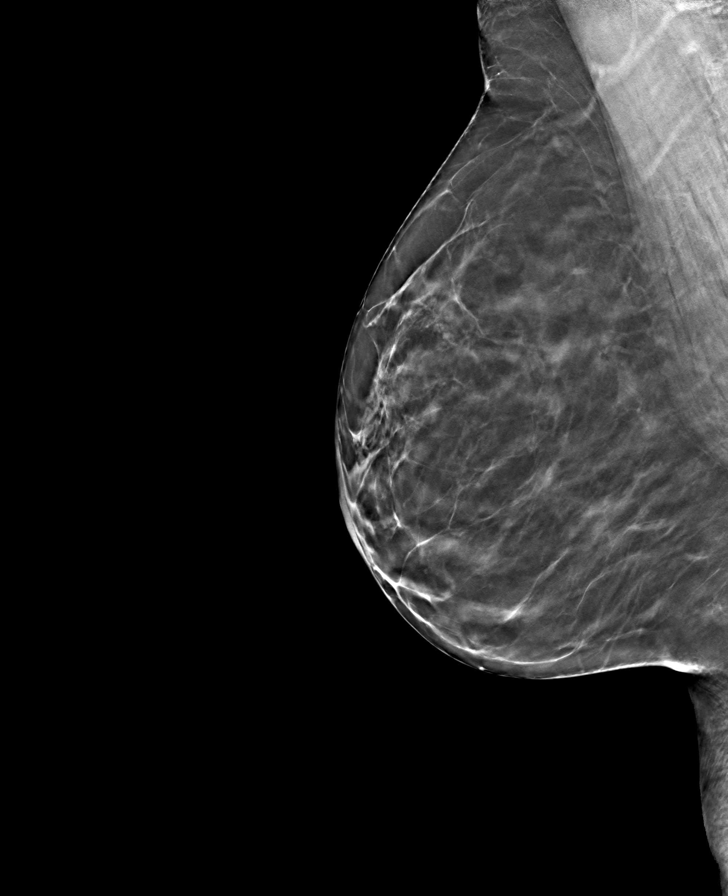

[L CC tomo · tomo slice 37/74.0]
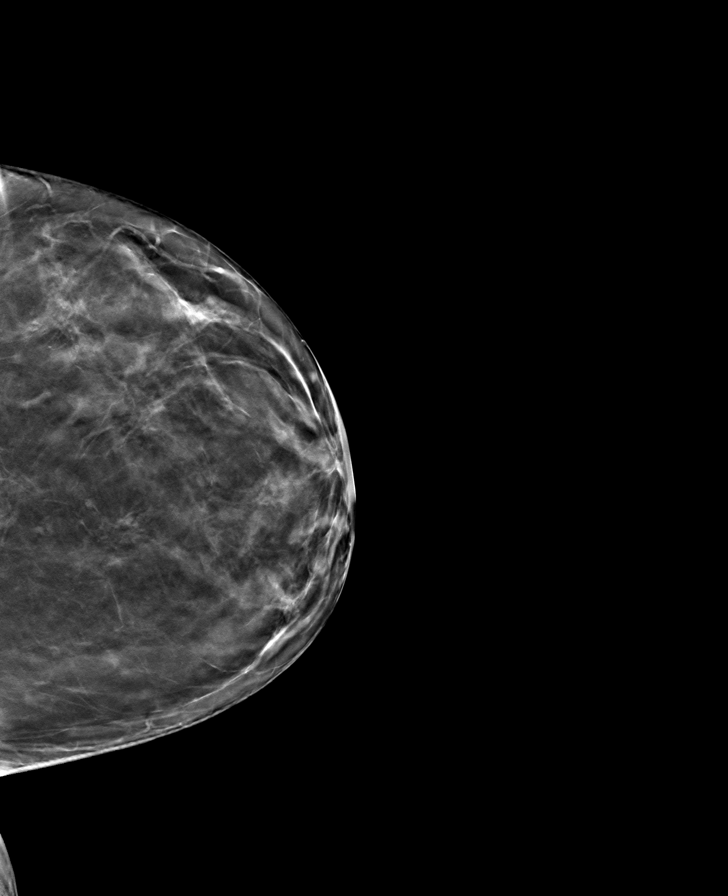

[L MLO tomo · tomo slice 35/69.0]
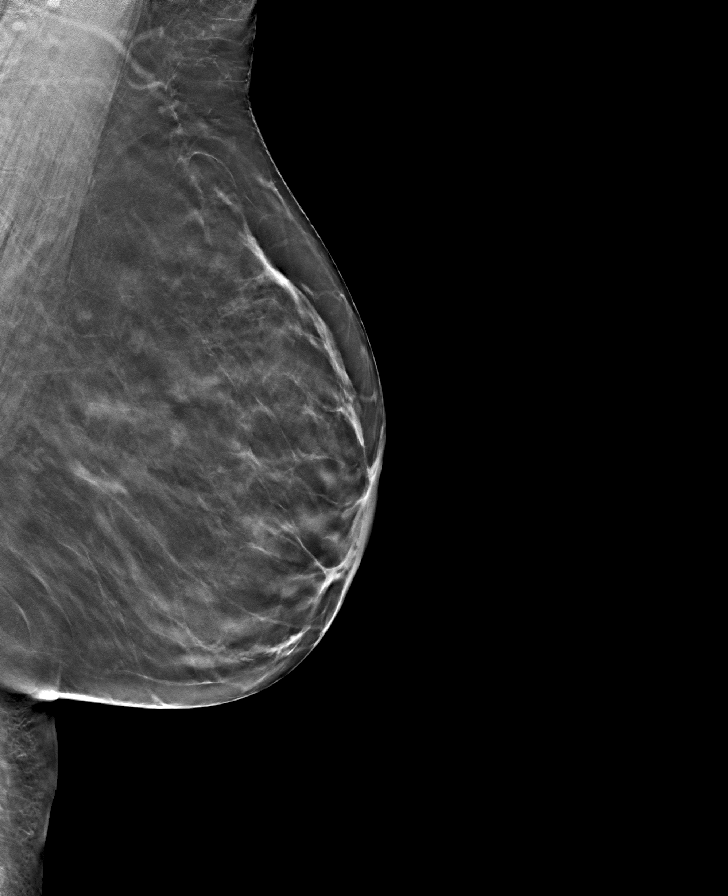

[R CC tomo · tomo slice 35/70.0]
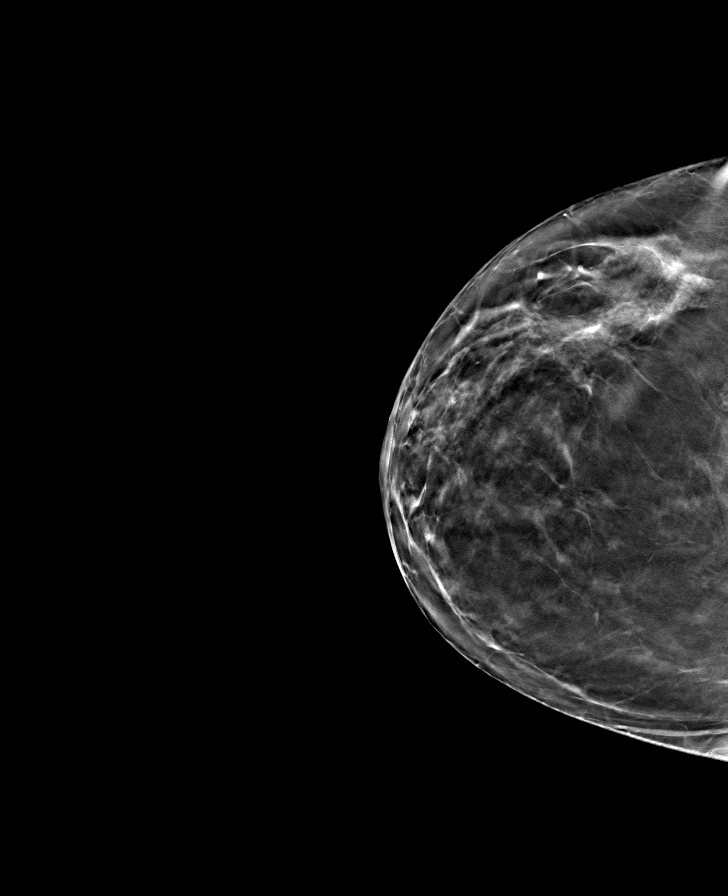

[8 of 24 positions shown; findings below may reference images not displayed]

ACR Breast Density Category b: There are scattered areas of
fibroglandular density.
FINDINGS: There are no findings suspicious for malignancy.
IMPRESSION: No mammographic evidence of malignancy. A result letter of this
screening mammogram will be mailed directly to the patient.

RECOMMENDATION:
Screening mammogram in one year. (Code:51-O-LD2)

BI-RADS CATEGORY  1: Negative.

## 2023-04-11 ENCOUNTER — Encounter: Payer: Self-pay | Admitting: Internal Medicine

## 2023-05-07 LAB — TSH: TSH: 1.5 (ref 0.41–5.90)

## 2023-07-04 ENCOUNTER — Encounter: Payer: Self-pay | Admitting: Internal Medicine

## 2023-07-19 ENCOUNTER — Encounter: Payer: Self-pay | Admitting: Internal Medicine

## 2023-07-19 ENCOUNTER — Ambulatory Visit (INDEPENDENT_AMBULATORY_CARE_PROVIDER_SITE_OTHER): Admitting: Internal Medicine

## 2023-07-19 VITALS — BP 128/80 | HR 81 | Ht 66.0 in | Wt 146.0 lb

## 2023-07-19 DIAGNOSIS — E559 Vitamin D deficiency, unspecified: Secondary | ICD-10-CM

## 2023-07-19 DIAGNOSIS — Z789 Other specified health status: Secondary | ICD-10-CM | POA: Insufficient documentation

## 2023-07-19 DIAGNOSIS — Z1231 Encounter for screening mammogram for malignant neoplasm of breast: Secondary | ICD-10-CM | POA: Diagnosis not present

## 2023-07-19 DIAGNOSIS — E782 Mixed hyperlipidemia: Secondary | ICD-10-CM | POA: Diagnosis not present

## 2023-07-19 DIAGNOSIS — Z780 Asymptomatic menopausal state: Secondary | ICD-10-CM

## 2023-07-19 DIAGNOSIS — F40243 Fear of flying: Secondary | ICD-10-CM | POA: Insufficient documentation

## 2023-07-19 DIAGNOSIS — K2100 Gastro-esophageal reflux disease with esophagitis, without bleeding: Secondary | ICD-10-CM | POA: Diagnosis not present

## 2023-07-19 DIAGNOSIS — E039 Hypothyroidism, unspecified: Secondary | ICD-10-CM

## 2023-07-19 DIAGNOSIS — M8580 Other specified disorders of bone density and structure, unspecified site: Secondary | ICD-10-CM | POA: Insufficient documentation

## 2023-07-19 DIAGNOSIS — Z8619 Personal history of other infectious and parasitic diseases: Secondary | ICD-10-CM

## 2023-07-19 MED ORDER — DIAZEPAM 5 MG PO TABS
5.0000 mg | ORAL_TABLET | Freq: Three times a day (TID) | ORAL | 0 refills | Status: AC | PRN
Start: 2023-07-19 — End: ?

## 2023-07-19 NOTE — Progress Notes (Signed)
 Date:  07/19/2023   Name:  Cassandra Hamilton   DOB:  April 16, 1955   MRN:  [id number]   Chief Complaint: Annual Exam Cassandra Hamilton is a 68 y.o. female who presents today for her Complete Annual Exam. She feels well. She reports exercising. She reports she is sleeping poorly. Breast complaints - none.  Health Maintenance  Topic Date Due   COVID-19 Vaccine (5 - 2024-25 season) 09/24/2022   Medicare Annual Wellness Visit  08/24/2023   Flu Shot  08/24/2023   Mammogram  09/06/2023   Colon Cancer Screening  11/20/2030   DTaP/Tdap/Td vaccine (3 - Td or Tdap) 06/29/2032   Pneumococcal Vaccine for age over 36  Completed   DEXA scan (bone density measurement)  Completed   Hepatitis C Screening  Completed   Zoster (Shingles) Vaccine  Completed   Hepatitis B Vaccine  Aged Out   HPV Vaccine  Aged Out   Meningitis B Vaccine  Aged Out    Thyroid Problem Patient reports no constipation, diarrhea, fatigue or palpitations. Her past medical history is significant for hyperlipidemia.  Hyperlipidemia This is a chronic problem. Recent lipid tests were reviewed and are high. Pertinent negatives include no chest pain, myalgias or shortness of breath. Current antihyperlipidemic treatment includes diet change and exercise.  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  Review of Systems  Constitutional:  Negative for fatigue and unexpected weight change.  HENT:  Negative for trouble swallowing.   Eyes:  Negative for visual disturbance.  Respiratory:  Negative for cough, chest tightness, shortness of breath and wheezing.   Cardiovascular:  Negative for chest pain, palpitations and leg swelling.  Gastrointestinal:  Negative for abdominal pain, constipation and diarrhea.  Musculoskeletal:  Negative for arthralgias and myalgias.  Skin:        Nail changes on right great toe - treating with Tea Tree oil  Neurological:  Negative for dizziness, weakness, light-headedness and  headaches.     Lab Results  Component Value Date   NA 139 06/30/2022   K 4.2 06/30/2022   CO2 23 06/30/2022   GLUCOSE 93 06/30/2022   BUN 12 06/30/2022   CREATININE 0.70 06/30/2022   CALCIUM 9.2 06/30/2022   EGFR 95 06/30/2022   GFRNONAA 95 04/29/2019   Lab Results  Component Value Date   CHOL 235 (H) 06/30/2022   HDL 49 06/30/2022   LDLCALC 154 (H) 06/30/2022   TRIG 174 (H) 06/30/2022   CHOLHDL 4.8 (H) 06/30/2022   Lab Results  Component Value Date   TSH 1.50 05/07/2023   No results found for: HGBA1C Lab Results  Component Value Date   WBC 7.2 06/30/2022   HGB 14.6 06/30/2022   HCT 42.2 06/30/2022   MCV 96 06/30/2022   PLT 291 06/30/2022   Lab Results  Component Value Date   ALT 14 06/30/2022   AST 17 06/30/2022   ALKPHOS 105 06/30/2022   BILITOT 0.5 06/30/2022   Lab Results  Component Value Date   VD25OH 33.4 06/30/2022     Patient Active Problem List   Diagnosis Date Noted   Consumes a vegan diet 07/19/2023   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   Osteopenia after menopause 07/19/2023   Vitamin D  deficiency 06/30/2022   Acquired hypothyroidism 04/29/2020   History of cold sores 06/11/2019   Hx of abnormal cervical Pap smear 05/08/2019   Mixed hyperlipidemia 12/25/2018   Gastroesophageal reflux disease with esophagitis without hemorrhage 12/25/2018  Primary osteoarthritis of both hands 12/25/2018   Tinnitus of both ears 12/06/2017    Allergies  Allergen Reactions   Ciprofloxacin     Rash    Latex Rash    Past Surgical History:  Procedure Laterality Date   BREAST BIOPSY Left 2005   neg   CESAREAN SECTION     x3   COLONOSCOPY N/A 11/19/2020   Procedure: COLONOSCOPY;  Surgeon: Jinny Carmine, MD;  Location: [facility];  Service: Endoscopy;  Laterality: N/A;    Social History   Tobacco Use   Smoking status: Former    Current packs/day: 0.00    Average packs/day: 0.5 packs/day for 30.0 years (15.0 ttl pk-yrs)    Types: Cigarettes     Start date: 75    Quit date: 68    Years since quitting: 35.5    Passive exposure: Past   Smokeless tobacco: Never  Vaping Use   Vaping status: Never Used  Substance Use Topics   Alcohol use: Yes    Alcohol/week: 1.0 standard drink of alcohol    Types: 1 Standard drinks or equivalent per week   Drug use: Never     Medication list has been reviewed and updated.  Current Meds  Medication Sig   Cholecalciferol 50 MCG (2000 UT) TABS Take by mouth.   CRANBERRY EXTRACT PO Take by mouth.   cyanocobalamin (VITAMIN B12) 500 MCG tablet Take 500 mcg by mouth daily.   estradiol  (ESTRACE ) 0.1 MG/GM vaginal cream Estrogen Cream Instruction Discard applicator Apply pea sized amount to tip of finger to urethra before bed. Wash hands well after application. Use Monday, Wednesday and Friday   levothyroxine (SYNTHROID) 75 MCG tablet Take 75 mcg by mouth daily before breakfast.   valACYclovir  (VALTREX ) 1000 MG tablet Take 1 tablet (1,000 mg total) by mouth 2 (two) times daily as needed.   [DISCONTINUED] diazepam  (VALIUM ) 5 MG tablet Take 1 tablet (5 mg total) by mouth every 8 (eight) hours as needed for anxiety.       07/19/2023    8:43 AM 11/06/2022   10:47 AM 06/30/2022   10:31 AM 05/02/2021    8:02 AM  GAD 7 : Generalized Anxiety Score  Nervous, Anxious, on Edge 2 2 1  0  Control/stop worrying 2 0 1 0  Worry too much - different things 2 3 1  0  Trouble relaxing 2 2 1  0  Restless 2 2 1  0  Easily annoyed or irritable 2 1 0 0  Afraid - awful might happen 2 2 1  0  Total GAD 7 Score 14 12 6  0  Anxiety Difficulty Very difficult Somewhat difficult Somewhat difficult Not difficult at all       07/19/2023    8:43 AM 11/06/2022   10:47 AM 08/24/2022    9:31 AM  Depression screen PHQ 2/9  Decreased Interest 0 0 0  Down, Depressed, Hopeless 0 0 0  PHQ - 2 Score 0 0 0  Altered sleeping 3 3 0  Tired, decreased energy 3 1 0  Change in appetite 0 0 0  Feeling bad or failure about yourself  0 0 0   Trouble concentrating 0 0 0  Moving slowly or fidgety/restless 0 2 0  Suicidal thoughts 1 0 0  PHQ-9 Score 7 6 0  Difficult doing work/chores Not difficult at all Not difficult at all Not difficult at all    BP Readings from Last 3 Encounters:  07/19/23 128/80  01/03/23 128/70  11/28/22 118/76  Physical Exam Vitals and nursing note reviewed.  Constitutional:      General: She is not in acute distress.    Appearance: She is well-developed.  HENT:     Head: Normocephalic and atraumatic.     Right Ear: Tympanic membrane and ear canal normal.     Left Ear: Tympanic membrane and ear canal normal.     Nose:     Right Sinus: No maxillary sinus tenderness.     Left Sinus: No maxillary sinus tenderness.   Eyes:     General: No scleral icterus.       Right eye: No discharge.        Left eye: No discharge.     Conjunctiva/sclera: Conjunctivae normal.   Neck:     Thyroid: No thyromegaly.     Vascular: No carotid bruit.   Cardiovascular:     Rate and Rhythm: Normal rate and regular rhythm.     Pulses: Normal pulses.     Heart sounds: Normal heart sounds.  Pulmonary:     Effort: Pulmonary effort is normal. No respiratory distress.     Breath sounds: No wheezing.  Abdominal:     General: Bowel sounds are normal.     Palpations: Abdomen is soft.     Tenderness: There is no abdominal tenderness.   Musculoskeletal:     Cervical back: Normal range of motion. No erythema.     Right lower leg: No edema.     Left lower leg: No edema.  Lymphadenopathy:     Cervical: No cervical adenopathy.   Skin:    General: Skin is warm and dry.     Capillary Refill: Capillary refill takes less than 2 seconds.     Findings: No rash.     Comments: Mild fungal involvement of right great toenail -    Neurological:     Mental Status: She is alert and oriented to person, place, and time.     Cranial Nerves: No cranial nerve deficit.     Sensory: No sensory deficit.     Deep Tendon Reflexes:  Reflexes are normal and symmetric.   Psychiatric:        Attention and Perception: Attention normal.        Mood and Affect: Mood normal.     Wt Readings from Last 3 Encounters:  07/19/23 146 lb (66.2 kg)  11/28/22 148 lb (67.1 kg)  11/06/22 146 lb (66.2 kg)    BP 128/80   Pulse 81   Ht 5' 6 (1.676 m)   Wt 146 lb (66.2 kg)   SpO2 96%   BMI 23.57 kg/m   Assessment and Plan:  Problem List Items Addressed This Visit       Unprioritized   Acquired hypothyroidism - Primary (Chronic)   Followed by endocrinology Recent TSH 1.5 - no dose changes were made      Mixed hyperlipidemia   Managed with diet and exercise Lab Results  Component Value Date   LDLCALC 154 (H) 06/30/2022         Relevant Orders   Comprehensive metabolic panel with GFR   Lipid panel   Gastroesophageal reflux disease with esophagitis without hemorrhage   Occasional symptoms not requiring daily medication.      Relevant Orders   CBC with Differential/Platelet   History of cold sores   Uses Valtrex  prn She will request refills as needed      Vitamin D  deficiency   Last level was normal. Continue  daily supplement.      Consumes a vegan diet   Fear of flying   Relevant Medications   diazepam  (VALIUM ) 5 MG tablet   Osteopenia after menopause   Continue exercise, Vitamin D  and sufficient calcium daily      Relevant Orders   DG Bone Density   Other Visit Diagnoses       Encounter for screening mammogram for breast cancer       Relevant Orders   MM 3D SCREENING MAMMOGRAM BILATERAL BREAST       Return in about 1 year (around 07/18/2024) for Medicare annual dr. Lemon.    Leita HILARIO Adie, MD [facility] [place]

## 2023-07-19 NOTE — Assessment & Plan Note (Signed)
 Followed by endocrinology Recent TSH 1.5 - no dose changes were made

## 2023-07-19 NOTE — Assessment & Plan Note (Signed)
 Occasional symptoms not requiring daily medication.

## 2023-07-19 NOTE — Assessment & Plan Note (Signed)
 Last level was normal. Continue daily supplement.

## 2023-07-19 NOTE — Assessment & Plan Note (Signed)
 Continue exercise, Vitamin D  and sufficient calcium daily

## 2023-07-19 NOTE — Assessment & Plan Note (Signed)
 Uses Valtrex  prn She will request refills as needed

## 2023-07-19 NOTE — Assessment & Plan Note (Signed)
 Managed with diet and exercise Lab Results  Component Value Date   LDLCALC 154 (H) 06/30/2022

## 2023-07-19 NOTE — Patient Instructions (Addendum)
 Call [facility] to schedule your mammogram and Bone Density at [phone].  Continue Vitamin D ; also recommend 1200 mg per day of Calcium.

## 2023-07-20 ENCOUNTER — Ambulatory Visit: Payer: Self-pay | Admitting: Internal Medicine

## 2023-07-20 LAB — COMPREHENSIVE METABOLIC PANEL WITH GFR
ALT: 15 IU/L (ref 0–32)
AST: 18 IU/L (ref 0–40)
Albumin: 4.3 g/dL (ref 3.9–4.9)
Alkaline Phosphatase: 104 IU/L (ref 44–121)
BUN/Creatinine Ratio: 11 — ABNORMAL LOW (ref 12–28)
BUN: 7 mg/dL — ABNORMAL LOW (ref 8–27)
Bilirubin Total: 0.4 mg/dL (ref 0.0–1.2)
CO2: 20 mmol/L (ref 20–29)
Calcium: 9.3 mg/dL (ref 8.7–10.3)
Chloride: 105 mmol/L (ref 96–106)
Creatinine, Ser: 0.66 mg/dL (ref 0.57–1.00)
Globulin, Total: 2.1 g/dL (ref 1.5–4.5)
Glucose: 91 mg/dL (ref 70–99)
Potassium: 4.6 mmol/L (ref 3.5–5.2)
Sodium: 142 mmol/L (ref 134–144)
Total Protein: 6.4 g/dL (ref 6.0–8.5)
eGFR: 95 mL/min/{1.73_m2} (ref >59)

## 2023-07-20 LAB — CBC WITH DIFFERENTIAL/PLATELET
Basophils Absolute: 0.1 10*3/uL (ref 0.0–0.2)
Basos: 1 %
EOS (ABSOLUTE): 0.2 10*3/uL (ref 0.0–0.4)
Eos: 3 %
Hematocrit: 42.3 % (ref 34.0–46.6)
Hemoglobin: 14.6 g/dL (ref 11.1–15.9)
Immature Grans (Abs): 0 10*3/uL (ref 0.0–0.1)
Immature Granulocytes: 0 %
Lymphocytes Absolute: 2.8 10*3/uL (ref 0.7–3.1)
Lymphs: 37 %
MCH: 34.1 pg — ABNORMAL HIGH (ref 26.6–33.0)
MCHC: 34.5 g/dL (ref 31.5–35.7)
MCV: 99 fL — ABNORMAL HIGH (ref 79–97)
Monocytes Absolute: 0.7 10*3/uL (ref 0.1–0.9)
Monocytes: 9 %
Neutrophils Absolute: 3.7 10*3/uL (ref 1.4–7.0)
Neutrophils: 50 %
Platelets: 271 10*3/uL (ref 150–450)
RBC: 4.28 x10E6/uL (ref 3.77–5.28)
RDW: 12.5 % (ref 11.7–15.4)
WBC: 7.5 10*3/uL (ref 3.4–10.8)

## 2023-07-20 LAB — LIPID PANEL
Chol/HDL Ratio: 5 ratio — ABNORMAL HIGH (ref 0.0–4.4)
Cholesterol, Total: 216 mg/dL — ABNORMAL HIGH (ref 100–199)
HDL: 43 mg/dL (ref >39)
LDL Chol Calc (NIH): 138 mg/dL — ABNORMAL HIGH (ref 0–99)
Triglycerides: 195 mg/dL — ABNORMAL HIGH (ref 0–149)
VLDL Cholesterol Cal: 35 mg/dL (ref 5–40)

## 2023-08-30 ENCOUNTER — Encounter

## 2023-10-29 ENCOUNTER — Ambulatory Visit
Admission: RE | Admit: 2023-10-29 | Discharge: 2023-10-29 | Disposition: A | Discharge status: Home / Self Care | Source: Ambulatory Visit | Attending: Internal Medicine | Admitting: Internal Medicine

## 2023-10-29 DIAGNOSIS — Z780 Asymptomatic menopausal state: Secondary | ICD-10-CM | POA: Diagnosis present

## 2023-10-29 DIAGNOSIS — Z1231 Encounter for screening mammogram for malignant neoplasm of breast: Secondary | ICD-10-CM | POA: Diagnosis present

## 2023-10-29 DIAGNOSIS — M8580 Other specified disorders of bone density and structure, unspecified site: Secondary | ICD-10-CM | POA: Diagnosis present

## 2023-11-08 ENCOUNTER — Encounter

## 2023-11-21 ENCOUNTER — Ambulatory Visit (INDEPENDENT_AMBULATORY_CARE_PROVIDER_SITE_OTHER)

## 2023-11-21 VITALS — BP 116/64 | Ht 66.0 in | Wt 146.2 lb

## 2023-11-21 DIAGNOSIS — Z0000 Encounter for general adult medical examination without abnormal findings: Secondary | ICD-10-CM | POA: Diagnosis not present

## 2023-11-21 NOTE — Progress Notes (Signed)
 Subjective:   Cassandra Hamilton is a 68 y.o. who presents for a Medicare Wellness preventive visit.  As a reminder, Annual Wellness Visits don't include a physical exam, and some assessments may be limited, especially if this visit is performed virtually. We may recommend an in-person follow-up visit with your provider if needed.  Visit Complete: In person   Persons Participating in Visit: Patient.  AWV Questionnaire: No: Patient Medicare AWV questionnaire was not completed prior to this visit.  Cardiac Risk Factors include: advanced age (>48men, >83 women);dyslipidemia     Objective:    Today's Vitals   11/21/23 0922  BP: 116/64  Weight: 146 lb 3.2 oz (66.3 kg)  Height: 5' 6 (1.676 m)   Body mass index is 23.6 kg/m.     11/21/2023    9:37 AM 08/24/2022    9:32 AM 08/17/2021   10:23 AM 11/19/2020    7:44 AM 08/16/2020   11:00 AM 11/25/2018    2:36 PM  Advanced Directives  Does Patient Have a Medical Advance Directive? Yes Yes Yes Yes Yes No  Type of Estate Agent of [place];Living will Healthcare Power of [place];Living will Healthcare Power of [place] of [place];Living will Healthcare Power of [place];Living will;Out of facility DNR (pink MOST or yellow form)   Does patient want to make changes to medical advance directive? No - Patient declined No - Patient declined No - Patient declined No - Patient declined    Copy of Healthcare Power of Attorney in Chart? Yes - validated most recent copy scanned in chart (See row information) Yes - validated most recent copy scanned in chart (See row information) Yes - validated most recent copy scanned in chart (See row information) Yes - validated most recent copy scanned in chart (See row information) No - copy requested     Current Medications (verified) Outpatient Encounter Medications as of 11/21/2023  Medication Sig   Cholecalciferol 50 MCG (2000 UT) TABS Take by mouth.   CRANBERRY  EXTRACT PO Take by mouth.   cyanocobalamin (VITAMIN B12) 500 MCG tablet Take 500 mcg by mouth daily.   diazepam  (VALIUM ) 5 MG tablet Take 1 tablet (5 mg total) by mouth every 8 (eight) hours as needed for anxiety.   estradiol  (ESTRACE ) 0.1 MG/GM vaginal cream Estrogen Cream Instruction Discard applicator Apply pea sized amount to tip of finger to urethra before bed. Wash hands well after application. Use Monday, Wednesday and Friday   levothyroxine (SYNTHROID) 75 MCG tablet Take 75 mcg by mouth daily before breakfast.   valACYclovir  (VALTREX ) 1000 MG tablet Take 1 tablet (1,000 mg total) by mouth 2 (two) times daily as needed.   No facility-administered encounter medications on file as of 11/21/2023.    Allergies (verified) Ciprofloxacin and Latex   History: Past Medical History:  Diagnosis Date   ADHD (attention deficit hyperactivity disorder)    GERD (gastroesophageal reflux disease)    Hyperthyroidism    Thyroid disease    Past Surgical History:  Procedure Laterality Date   BREAST BIOPSY Left 2005   neg   CESAREAN SECTION     x3   COLONOSCOPY N/A 11/19/2020   Procedure: COLONOSCOPY;  Surgeon: Jinny Carmine, MD;  Location: [facility];  Service: Endoscopy;  Laterality: N/A;   Family History  Problem Relation Age of Onset   Cancer Mother    Testicular cancer Father    Diabetes Maternal Grandmother    Breast cancer Neg Hx    Social History  Socioeconomic History   Marital status: Married    Spouse name: Not on file   Number of children: 3   Years of education: Not on file   Highest education level: Associate degree: academic program  Occupational History   Not on file  Tobacco Use   Smoking status: Former    Current packs/day: 0.00    Average packs/day: 0.5 packs/day for 30.0 years (15.0 ttl pk-yrs)    Types: Cigarettes    Start date: 27    Quit date: 2    Years since quitting: 35.8    Passive exposure: Past   Smokeless tobacco: Never  Vaping Use    Vaping status: Never Used  Substance and Sexual Activity   Alcohol use: Yes    Alcohol/week: 1.0 standard drink of alcohol    Types: 1 Standard drinks or equivalent per week   Drug use: Never   Sexual activity: Yes  Other Topics Concern   Not on file  Social History Narrative   Pt eats plant based diet/vegan   Social Drivers of Health   Financial Resource Strain: Low Risk  (11/21/2023)   Overall Financial Resource Strain (CARDIA)    Difficulty of Paying Living Expenses: Not hard at all  Food Insecurity: No Food Insecurity (11/21/2023)   Hunger Vital Sign    Worried About Running Out of Food in the Last Year: Never true    Ran Out of Food in the Last Year: Never true  Transportation Needs: No Transportation Needs (11/21/2023)   PRAPARE - Administrator, Civil Service (Medical): No    Lack of Transportation (Non-Medical): No  Physical Activity: Insufficiently Active (11/21/2023)   Exercise Vital Sign    Days of Exercise per Week: 3 days    Minutes of Exercise per Session: 30 min  Stress: No Stress Concern Present (11/21/2023)   [employer] of Occupational Health - Occupational Stress Questionnaire    Feeling of Stress: Only a little  Social Connections: Moderately Integrated (11/21/2023)   Social Connection and Isolation Panel    Frequency of Communication with Friends and Family: Once a week    Frequency of Social Gatherings with Friends and Family: Three times a week    Attends Religious Services: Never    Active Member of Clubs or Organizations: Yes    Attends Engineer, Structural: More than 4 times per year    Marital Status: Married    Tobacco Counseling Counseling given: Not Answered    Clinical Intake:  Pre-visit preparation completed: Yes  Pain : No/denies pain     BMI - recorded: 23.6 Nutritional Status: BMI of 19-24  Normal Nutritional Risks: None Diabetes: No  No results found for: HGBA1C   How often do you need to  have someone help you when you read instructions, pamphlets, or other written materials from your doctor or pharmacy?: 1 - Never  Interpreter Needed?: No  Information entered by :: JHONNIE DAS, LPN   Activities of Daily Living     11/21/2023    9:39 AM  In your present state of health, do you have any difficulty performing the following activities:  Hearing? 1  Vision? 0  Difficulty concentrating or making decisions? 0  Walking or climbing stairs? 0  Dressing or bathing? 0  Doing errands, shopping? 0  Preparing Food and eating ? N  Using the Toilet? N  In the past six months, have you accidently leaked urine? N  Do you have problems with loss  of bowel control? N  Managing your Medications? N  Managing your Finances? N  Housekeeping or managing your Housekeeping? N    Patient Care Team: Justus Leita DEL, MD as PCP - General (Internal Medicine) Cherilyn Debby CROME, MD as Consulting Physician (Internal Medicine) Ward, Mitzie BROCKS, MD as Referring Physician (Obstetrics and Gynecology)  I have updated your Care Teams any recent Medical Services you may have received from other providers in the past year.     Assessment:   This is a routine wellness examination for [place].  Hearing/Vision screen Hearing Screening - Comments:: RIGHT EAR HEARING AID Vision Screening - Comments:: READERS FOR [place], WATCHING TV, DRIVING- [facility]-SEEN ONCE PER YEAR   Goals Addressed             This Visit's Progress    DIET - INCREASE WATER  INTAKE         Depression Screen     11/21/2023    9:30 AM 07/19/2023    8:43 AM 11/06/2022   10:47 AM 08/24/2022    9:31 AM 06/30/2022   10:31 AM 08/17/2021   10:25 AM 08/17/2021   10:18 AM  PHQ 2/9 Scores  PHQ - 2 Score 2 0 0 0 0 0 0  PHQ- 9 Score 3 7 6  0 4      Fall Risk     11/21/2023    9:38 AM 07/19/2023    8:43 AM 11/06/2022   10:47 AM 08/24/2022    9:33 AM 08/24/2022    8:51 AM  Fall Risk   Falls in the past year? 0  0 0 1 1  Number falls in past yr: 0 0 0 0 0  Injury with Fall? 0 0 0 0 1  Risk for fall due to : No Fall Risks No Fall Risks No Fall Risks History of fall(s)   Follow up Falls evaluation completed;Falls prevention discussed Falls evaluation completed Falls evaluation completed Falls evaluation completed;Falls prevention discussed     MEDICARE RISK AT HOME:  Medicare Risk at Home Any stairs in or around the home?: Yes If so, are there any without handrails?: No Home free of loose throw rugs in walkways, pet beds, electrical cords, etc?: Yes Adequate lighting in your home to reduce risk of falls?: Yes Life alert?: Yes (IWATCH FALL ALERTS) Use of a cane, walker or w/c?: No Grab bars in the bathroom?: No Shower chair or bench in shower?: No Elevated toilet seat or a handicapped toilet?: Yes  TIMED UP AND GO:  Was the test performed?  Yes  Length of time to ambulate 10 feet: 4 sec Gait steady and fast without use of assistive device  Cognitive Function: 6CIT completed        11/21/2023    9:41 AM 08/24/2022    9:34 AM 08/17/2021   10:27 AM  6CIT Screen  What Year? 0 points 0 points 0 points  What month? 0 points 0 points 0 points  What time? 0 points 0 points 0 points  Count back from 20 0 points 0 points 0 points  Months in reverse 0 points 0 points 0 points  Repeat phrase 0 points 0 points 0 points  Total Score 0 points 0 points 0 points    Immunizations Immunization History  Administered Date(s) Administered   Fluad Quad(high Dose 65+) 01/01/2020, 01/10/2021   Fluzone Influenza virus vaccine,trivalent (IIV3), split virus 11/04/2012, 09/03/2015   Hepatitis A 10/27/2014   Hepatitis A, Adult 10/27/2014, 05/05/2015, 09/03/2015  Influenza,inj,Quad PF,6+ Mos 11/25/2018   Influenza-Unspecified 10/27/2014, 10/24/2018, 11/25/2018   PFIZER Comirnaty(Gray Top)Covid-19 Tri-Sucrose Vaccine 04/29/2019, 05/20/2019   PFIZER(Purple Top)SARS-COV-2 Vaccination 04/29/2019, 05/20/2019    PNEUMOCOCCAL CONJUGATE-20 05/10/2020   Pneumococcal Conjugate-13 09/03/2015   Tdap 04/08/2009, 06/30/2022   Zoster Recombinant(Shingrix) 12/17/2016, 12/09/2018   Zoster, Live 05/05/2015    Screening Tests Health Maintenance  Topic Date Due   Influenza Vaccine  08/24/2023   COVID-19 Vaccine (5 - 2025-26 season) 09/24/2023   Mammogram  10/28/2024   Medicare Annual Wellness (AWV)  11/20/2024   DEXA SCAN  10/28/2028   Colonoscopy  11/20/2030   DTaP/Tdap/Td (3 - Td or Tdap) 06/29/2032   Pneumococcal Vaccine: 50+ Years  Completed   Hepatitis C Screening  Completed   Zoster Vaccines- Shingrix  Completed   Meningococcal B Vaccine  Aged Out    Health Maintenance Items Addressed: NEEDS FLU SHOTS, DECLINES TODAY; UTD MAMMOGRAM, COLONOSCOPY & BDS  Additional Screening:  Vision Screening: Recommended annual ophthalmology exams for early detection of glaucoma and other disorders of the eye. Is the patient up to date with their annual eye exam?  Yes  Who is the provider or what is the name of the office in which the patient attends annual eye exams? [facility]  Dental Screening: Recommended annual dental exams for proper oral hygiene  Community Resource Referral / Chronic Care Management: CRR required this visit?  No   CCM required this visit?  No   Plan:    I have personally reviewed and noted the following in the patient's chart:   Medical and social history Use of alcohol, tobacco or illicit drugs  Current medications and supplements including opioid prescriptions. Patient is not currently taking opioid prescriptions. Functional ability and status Nutritional status Physical activity Advanced directives List of other physicians Hospitalizations, surgeries, and ER visits in previous 12 months Vitals Screenings to include cognitive, depression, and falls Referrals and appointments  In addition, I have reviewed and discussed with patient certain preventive  protocols, quality metrics, and best practice recommendations. A written personalized care plan for preventive services as well as general preventive health recommendations were provided to patient.   Jhonnie GORMAN Das, LPN   [id number]   After Visit Summary: (In Person-Declined) Patient declined AVS at this time.  Notes: Nothing significant to report at this time.

## 2023-11-21 NOTE — Patient Instructions (Addendum)
 [name],  Thank you for taking the time for your Medicare Wellness Visit. I appreciate your continued commitment to your health goals. Please review the care plan we discussed, and feel free to reach out if I can assist you further.  Medicare recommends these wellness visits once per year to help you and your care team stay ahead of potential health issues. These visits are designed to focus on prevention, allowing your provider to concentrate on managing your acute and chronic conditions during your regular appointments.  Please note that Annual Wellness Visits do not include a physical exam. Some assessments may be limited, especially if the visit was conducted virtually. If needed, we may recommend a separate in-person follow-up with your provider.  Ongoing Care Seeing your primary care provider every 3 to 6 months helps us  monitor your health and provide consistent, personalized care.   Referrals If a referral was made during today's visit and you haven't received any updates within two weeks, please contact the referred provider directly to check on the status.  Recommended Screenings:  Health Maintenance  Topic Date Due   Flu Shot  08/24/2023   COVID-19 Vaccine (5 - 2025-26 season) 09/24/2023   Breast Cancer Screening  10/28/2024   Medicare Annual Wellness Visit  11/20/2024   DEXA scan (bone density measurement)  10/28/2028   Colon Cancer Screening  11/20/2030   DTaP/Tdap/Td vaccine (3 - Td or Tdap) 06/29/2032   Pneumococcal Vaccine for age over 24  Completed   Hepatitis C Screening  Completed   Zoster (Shingles) Vaccine  Completed   Meningitis B Vaccine  Aged Out       11/21/2023    9:37 AM  Advanced Directives  Does Patient Have a Medical Advance Directive? Yes  Type of Estate Agent of [place];Living will  Does patient want to make changes to medical advance directive? No - Patient declined  Copy of Healthcare Power of Attorney in Chart? Yes -  validated most recent copy scanned in chart (See row information)   Advance Care Planning is important because it: Ensures you receive medical care that aligns with your values, goals, and preferences. Provides guidance to your family and loved ones, reducing the emotional burden of decision-making during critical moments.  Vision: Annual vision screenings are recommended for early detection of glaucoma, cataracts, and diabetic retinopathy. These exams can also reveal signs of chronic conditions such as diabetes and high blood pressure.  Dental: Annual dental screenings help detect early signs of oral cancer, gum disease, and other conditions linked to overall health, including heart disease and diabetes.  Please see the attached documents for additional preventive care recommendations.   NEXT AWV 11/26/24 @ 10:10 AM IN PERSON

## 2024-01-08 ENCOUNTER — Ambulatory Visit: Payer: Self-pay | Admitting: Urology

## 2024-01-10 ENCOUNTER — Ambulatory Visit: Admitting: Urology

## 2024-02-08 ENCOUNTER — Ambulatory Visit: Admitting: Urology

## 2024-02-20 ENCOUNTER — Ambulatory Visit (INDEPENDENT_AMBULATORY_CARE_PROVIDER_SITE_OTHER): Admitting: Urology

## 2024-02-20 VITALS — BP 125/74 | HR 79 | Ht 66.0 in | Wt 142.0 lb

## 2024-02-20 DIAGNOSIS — N958 Other specified menopausal and perimenopausal disorders: Secondary | ICD-10-CM | POA: Diagnosis not present

## 2024-02-20 MED ORDER — ESTRADIOL 0.01 % VA CREA: TOPICAL_CREAM | VAGINAL | 12 refills | Status: AC

## 2024-02-20 NOTE — Progress Notes (Signed)
" ° °  02/20/2024 10:09 AM   Cassandra Hamilton [birth year] [id number]  Reason for visit: Follow up urinary symptoms/genitourinary syndrome of menopause  History: Initial visit November 2024 with symptoms of pelvic pressure/urgency/dysuria/pelvic tightness.  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

## 2024-07-23 ENCOUNTER — Encounter: Admitting: Student

## 2024-11-26 ENCOUNTER — Ambulatory Visit
